# Patient Record
Sex: Male | Born: 1985 | Race: Black or African American | Hispanic: No | Marital: Single | Smoking: Current every day smoker
Health system: Southern US, Community
[De-identification: ages and names within clinical notes are randomized; demographics above are authoritative.]

---

## 2012-04-13 ENCOUNTER — Encounter (HOSPITAL_COMMUNITY): Payer: Self-pay | Admitting: Emergency Medicine

## 2012-04-13 ENCOUNTER — Emergency Department (INDEPENDENT_AMBULATORY_CARE_PROVIDER_SITE_OTHER)
Admission: EM | Admit: 2012-04-13 | Discharge: 2012-04-13 | Disposition: A | Discharge status: Home / Self Care | Payer: Self-pay | Source: Home / Self Care | Attending: Family Medicine | Admitting: Family Medicine

## 2012-04-13 DIAGNOSIS — S04039A Injury of optic tract and pathways, unspecified eye, initial encounter: Secondary | ICD-10-CM

## 2012-04-13 DIAGNOSIS — S04019A Injury of optic nerve, unspecified eye, initial encounter: Secondary | ICD-10-CM

## 2012-04-13 DIAGNOSIS — S0590XA Unspecified injury of unspecified eye and orbit, initial encounter: Secondary | ICD-10-CM

## 2012-04-13 MED ORDER — ACETAMINOPHEN-CODEINE #3 300-30 MG PO TABS: 1.0000 | ORAL_TABLET | Freq: Three times a day (TID) | ORAL | Status: AC | PRN
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
## 2012-06-01 ENCOUNTER — Encounter (INDEPENDENT_AMBULATORY_CARE_PROVIDER_SITE_OTHER): Payer: Medicaid Other | Admitting: Ophthalmology

## 2012-06-01 DIAGNOSIS — H35379 Puckering of macula, unspecified eye: Secondary | ICD-10-CM

## 2012-06-01 DIAGNOSIS — H31009 Unspecified chorioretinal scars, unspecified eye: Secondary | ICD-10-CM

## 2012-06-01 DIAGNOSIS — H35349 Macular cyst, hole, or pseudohole, unspecified eye: Secondary | ICD-10-CM

## 2012-06-01 DIAGNOSIS — S0510XA Contusion of eyeball and orbital tissues, unspecified eye, initial encounter: Secondary | ICD-10-CM

## 2012-06-01 DIAGNOSIS — H43819 Vitreous degeneration, unspecified eye: Secondary | ICD-10-CM

## 2012-06-09 ENCOUNTER — Ambulatory Visit (INDEPENDENT_AMBULATORY_CARE_PROVIDER_SITE_OTHER): Payer: Medicaid Other | Admitting: Internal Medicine

## 2012-06-09 ENCOUNTER — Encounter: Payer: Self-pay | Admitting: Internal Medicine

## 2012-06-09 VITALS — BP 109/73 | HR 61 | Temp 98.1°F

## 2012-06-09 DIAGNOSIS — G44329 Chronic post-traumatic headache, not intractable: Secondary | ICD-10-CM

## 2012-06-09 DIAGNOSIS — Z23 Encounter for immunization: Secondary | ICD-10-CM

## 2012-06-09 DIAGNOSIS — Z299 Encounter for prophylactic measures, unspecified: Secondary | ICD-10-CM

## 2012-06-09 DIAGNOSIS — F4310 Post-traumatic stress disorder, unspecified: Secondary | ICD-10-CM | POA: Insufficient documentation

## 2012-06-09 DIAGNOSIS — Z654 Victim of crime and terrorism: Secondary | ICD-10-CM | POA: Insufficient documentation

## 2012-06-09 DIAGNOSIS — G44059 Short lasting unilateral neuralgiform headache with conjunctival injection and tearing (SUNCT), not intractable: Secondary | ICD-10-CM

## 2012-06-09 DIAGNOSIS — R51 Headache: Secondary | ICD-10-CM

## 2012-06-09 MED ORDER — GABAPENTIN 100 MG PO CAPS: 100.0000 mg | ORAL_CAPSULE | Freq: Three times a day (TID) | ORAL | Status: DC
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
## 2012-06-12 NOTE — Progress Notes (Signed)
Agree with plan. Thank you

## 2012-06-16 ENCOUNTER — Telehealth: Payer: Self-pay | Admitting: *Deleted

## 2012-06-20 NOTE — Telephone Encounter (Signed)
Unable to reach someone

## 2012-06-22 ENCOUNTER — Ambulatory Visit (HOSPITAL_COMMUNITY): Payer: Medicaid Other

## 2012-06-23 ENCOUNTER — Encounter: Payer: Medicaid Other | Admitting: Internal Medicine

## 2012-06-23 ENCOUNTER — Ambulatory Visit (HOSPITAL_COMMUNITY)
Admission: RE | Admit: 2012-06-23 | Discharge: 2012-06-23 | Disposition: A | Discharge status: Home / Self Care | Payer: Medicaid Other | Source: Ambulatory Visit | Attending: Internal Medicine | Admitting: Internal Medicine

## 2012-06-23 ENCOUNTER — Ambulatory Visit (INDEPENDENT_AMBULATORY_CARE_PROVIDER_SITE_OTHER): Payer: Medicaid Other | Admitting: Internal Medicine

## 2012-06-23 VITALS — Ht 69.0 in | Wt 138.3 lb

## 2012-06-23 DIAGNOSIS — G44059 Short lasting unilateral neuralgiform headache with conjunctival injection and tearing (SUNCT), not intractable: Secondary | ICD-10-CM | POA: Insufficient documentation

## 2012-06-23 DIAGNOSIS — G44329 Chronic post-traumatic headache, not intractable: Secondary | ICD-10-CM

## 2012-06-23 DIAGNOSIS — R51 Headache: Secondary | ICD-10-CM | POA: Insufficient documentation

## 2012-06-23 MED ORDER — IOHEXOL 300 MG/ML  SOLN
80.0000 mL | Freq: Once | INTRAMUSCULAR | Status: AC | PRN
  Administered 2012-06-23: 80 mL via INTRAVENOUS

## 2012-06-24 NOTE — Progress Notes (Signed)
Patient ID: Bobby Vance, male   DOB: 1985-08-14, 26 y.o.   MRN: [id number]   Pt was sent to Radiology without being evaluated as he was to have CT head with contrast before his clinic visit.

## 2012-07-11 ENCOUNTER — Emergency Department (HOSPITAL_COMMUNITY)
Admission: EM | Admit: 2012-07-11 | Discharge: 2012-07-11 | Disposition: A | Discharge status: Home / Self Care | Payer: Medicaid Other | Attending: Emergency Medicine | Admitting: Emergency Medicine

## 2012-07-11 ENCOUNTER — Encounter (HOSPITAL_COMMUNITY): Payer: Self-pay | Admitting: Emergency Medicine

## 2012-07-11 DIAGNOSIS — G44329 Chronic post-traumatic headache, not intractable: Secondary | ICD-10-CM | POA: Insufficient documentation

## 2012-07-11 DIAGNOSIS — H539 Unspecified visual disturbance: Secondary | ICD-10-CM | POA: Insufficient documentation

## 2012-07-11 DIAGNOSIS — H35379 Puckering of macula, unspecified eye: Secondary | ICD-10-CM | POA: Insufficient documentation

## 2012-07-11 DIAGNOSIS — F17200 Nicotine dependence, unspecified, uncomplicated: Secondary | ICD-10-CM | POA: Insufficient documentation

## 2012-07-11 DIAGNOSIS — R110 Nausea: Secondary | ICD-10-CM | POA: Insufficient documentation

## 2012-07-11 DIAGNOSIS — H35349 Macular cyst, hole, or pseudohole, unspecified eye: Secondary | ICD-10-CM | POA: Insufficient documentation

## 2012-07-11 MED ORDER — KETOROLAC TROMETHAMINE 30 MG/ML IJ SOLN
30.0000 mg | Freq: Once | INTRAMUSCULAR | Status: AC
  Administered 2012-07-11: 30 mg via INTRAVENOUS
  Filled 2012-07-11: qty 1

## 2012-07-11 MED ORDER — IBUPROFEN 600 MG PO TABS: 600.0000 mg | ORAL_TABLET | Freq: Four times a day (QID) | ORAL | Status: DC | PRN

## 2012-07-11 MED ORDER — ONDANSETRON HCL 4 MG/2ML IJ SOLN
4.0000 mg | Freq: Once | INTRAMUSCULAR | Status: AC
  Administered 2012-07-11: 4 mg via INTRAVENOUS
  Filled 2012-07-11: qty 2

## 2012-07-11 NOTE — ED Notes (Addendum)
PT. REPORTS PERSISTENT HEADACHE/SLIGHT DIZZY  FOR SEVERAL DAYS WORSE THIS EVENING , DENIES INJURY , NO NAUSEA OR VOMITTING , DENIES FEVER OR CHILLS , NO BLURRED VISION , PT. SPEAKS [place] ( [place]) - USED INTERPRETER PHONE DURING ENCOUNTER AT TRIAGE .

## 2012-07-11 NOTE — ED Notes (Signed)
Pt in via EMS, per EMS- pt in c/o headache and n/v, IV started PTA.

## 2012-07-11 NOTE — ED Notes (Signed)
[employer] officer replied that a officer has been dispatched to come get pt from ED. Pt remains in room waiting for ride.

## 2012-07-11 NOTE — ED Notes (Signed)
Earley Favor remains at bedside with interpreter on phone

## 2012-07-11 NOTE — ED Notes (Signed)
Earley Favor, NP at bedside
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
## 2012-07-11 NOTE — ED Notes (Signed)
Pt states that he will need a ride home; states he is not familiar with the area.

## 2012-07-11 NOTE — ED Notes (Signed)
Used translator phone to give pt d/c instructions and prescription. Pt states understands d/c teaching and prescription and follow up information. Pt has no more questions and is waiting for ride from [facility] officer that is on the way. Pt states decrease in pain.

## 2012-07-11 NOTE — ED Notes (Signed)
Spoke with [employer] about getting pt a ride home.

## 2012-07-11 NOTE — ED Notes (Signed)
Communicating with pt via interpreter phone. Pt states room is spinning; pt states pain better since pain medication; pt states pain 5/10.

## 2012-07-11 NOTE — ED Notes (Signed)
[employer] office came to pick up pt from ED and will be taking pt home.
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
## 2012-07-12 MED ORDER — LIDOCAINE HCL (CARDIAC) 20 MG/ML IV SOLN
INTRAVENOUS | Status: AC
  Filled 2012-07-12: qty 5

## 2012-07-12 MED ORDER — ETOMIDATE 2 MG/ML IV SOLN
INTRAVENOUS | Status: AC
  Filled 2012-07-12: qty 20

## 2012-07-12 MED ORDER — SUCCINYLCHOLINE CHLORIDE 20 MG/ML IJ SOLN
INTRAMUSCULAR | Status: AC
  Filled 2012-07-12: qty 1

## 2012-07-12 MED ORDER — MIDAZOLAM HCL 2 MG/2ML IJ SOLN
INTRAMUSCULAR | Status: AC
  Filled 2012-07-12: qty 2

## 2012-07-12 MED ORDER — ROCURONIUM BROMIDE 50 MG/5ML IV SOLN
INTRAVENOUS | Status: AC
  Filled 2012-07-12: qty 2

## 2012-07-12 NOTE — ED Provider Notes (Signed)
Medical screening examination/treatment/procedure(s) were performed by non-physician practitioner and as supervising physician I was immediately available for consultation/collaboration.  Flint Melter, MD 07/12/12 [phone]

## 2012-07-18 ENCOUNTER — Emergency Department (HOSPITAL_COMMUNITY)
Admission: EM | Admit: 2012-07-18 | Discharge: 2012-07-19 | Disposition: A | Discharge status: Home / Self Care | Payer: Medicaid Other | Attending: Emergency Medicine | Admitting: Emergency Medicine

## 2012-07-18 DIAGNOSIS — F17200 Nicotine dependence, unspecified, uncomplicated: Secondary | ICD-10-CM | POA: Insufficient documentation

## 2012-07-18 DIAGNOSIS — G44329 Chronic post-traumatic headache, not intractable: Secondary | ICD-10-CM | POA: Insufficient documentation

## 2012-07-18 DIAGNOSIS — H547 Unspecified visual loss: Secondary | ICD-10-CM | POA: Insufficient documentation

## 2012-07-18 DIAGNOSIS — Z87828 Personal history of other (healed) physical injury and trauma: Secondary | ICD-10-CM | POA: Insufficient documentation

## 2012-07-18 NOTE — ED Notes (Signed)
Bed:WA14<BR> Expected date:<BR> Expected time:<BR> Means of arrival:<BR> Comments:<BR> EMS

## 2012-07-18 NOTE — ED Notes (Signed)
Headache for last 2 hours. Hx of headaches for last 2 years. Pt speaks Tigrinya. From [place]. Neuro intact. Ambulatory.

## 2012-07-19 MED ORDER — ONDANSETRON 8 MG PO TBDP
8.0000 mg | ORAL_TABLET | Freq: Once | ORAL | Status: AC
  Administered 2012-07-19: 8 mg via ORAL
  Filled 2012-07-19: qty 1

## 2012-07-19 MED ORDER — KETOROLAC TROMETHAMINE 30 MG/ML IJ SOLN
30.0000 mg | Freq: Once | INTRAMUSCULAR | Status: AC
  Administered 2012-07-19: 30 mg via INTRAMUSCULAR
  Filled 2012-07-19: qty 1

## 2012-07-19 MED ORDER — MELOXICAM 7.5 MG PO TABS: 7.5000 mg | ORAL_TABLET | Freq: Two times a day (BID) | ORAL | Status: DC
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
## 2012-07-21 ENCOUNTER — Encounter: Payer: Medicaid Other | Admitting: Internal Medicine

## 2012-07-21 ENCOUNTER — Ambulatory Visit (INDEPENDENT_AMBULATORY_CARE_PROVIDER_SITE_OTHER): Payer: Medicaid Other | Admitting: Internal Medicine

## 2012-07-21 DIAGNOSIS — G44309 Post-traumatic headache, unspecified, not intractable: Secondary | ICD-10-CM

## 2012-07-21 DIAGNOSIS — H5440 Blindness, one eye, unspecified eye: Secondary | ICD-10-CM

## 2012-07-21 MED ORDER — KETOROLAC TROMETHAMINE 30 MG/ML IM SOLN: 30.0000 mg | Freq: Once | INTRAMUSCULAR | Status: DC

## 2012-07-21 MED ORDER — KETOROLAC TROMETHAMINE 30 MG/ML IJ SOLN
30.0000 mg | Freq: Once | INTRAMUSCULAR | Status: AC
  Administered 2012-07-21: 30 mg via INTRAMUSCULAR

## 2012-07-21 MED ORDER — OXYCODONE-ACETAMINOPHEN 10-325 MG PO TABS: 1.0000 | ORAL_TABLET | Freq: Four times a day (QID) | ORAL | Status: DC | PRN
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
## 2012-07-21 NOTE — Addendum Note (Signed)
Addended by: Carolan Clines on: 07/21/2012 04:58 PM   Modules accepted: Orders

## 2012-07-22 NOTE — ED Provider Notes (Signed)
Medical screening examination/treatment/procedure(s) were performed by non-physician practitioner and as supervising physician I was immediately available for consultation/collaboration.  Yarelly Kuba, MD 07/22/12 0704 

## 2012-08-28 NOTE — Addendum Note (Signed)
Addended by: Neomia Dear on: 08/28/2012 07:11 PM   Modules accepted: Orders

## 2012-09-01 ENCOUNTER — Encounter: Payer: Medicaid Other | Admitting: Internal Medicine

## 2012-09-05 ENCOUNTER — Telehealth: Payer: Self-pay | Admitting: *Deleted

## 2012-09-05 NOTE — Telephone Encounter (Signed)
Call to Dr. Janetta Hora office to check on message sent back about referral.  Spoke to office person there who schedules the appointments.  Dr. Velora Heckler has declined to see the pt.-pt will need to be referred by the Opthalmology physician who saw him at [facility]-Dr. Luciano Cutter.  Call to Dr. Allie Dimmer office at [phone] spoke with Dewaine Oats who will get a message to Dr.  Luciano Cutter to see if a referral can be done.  Angelina Ok, RN 09/05/2012 11:10 AM.

## 2012-09-13 ENCOUNTER — Ambulatory Visit: Payer: Medicaid Other | Admitting: Internal Medicine

## 2012-09-14 ENCOUNTER — Emergency Department (HOSPITAL_COMMUNITY)
Admission: EM | Admit: 2012-09-14 | Discharge: 2012-09-14 | Disposition: A | Discharge status: Home / Self Care | Payer: Medicaid Other | Attending: Emergency Medicine | Admitting: Emergency Medicine

## 2012-09-14 DIAGNOSIS — R51 Headache: Secondary | ICD-10-CM | POA: Insufficient documentation

## 2012-09-14 DIAGNOSIS — H538 Other visual disturbances: Secondary | ICD-10-CM | POA: Insufficient documentation

## 2012-09-14 DIAGNOSIS — Z79899 Other long term (current) drug therapy: Secondary | ICD-10-CM | POA: Insufficient documentation

## 2012-09-14 DIAGNOSIS — F4310 Post-traumatic stress disorder, unspecified: Secondary | ICD-10-CM | POA: Insufficient documentation

## 2012-09-14 DIAGNOSIS — R209 Unspecified disturbances of skin sensation: Secondary | ICD-10-CM | POA: Insufficient documentation

## 2012-09-14 DIAGNOSIS — F17200 Nicotine dependence, unspecified, uncomplicated: Secondary | ICD-10-CM | POA: Insufficient documentation

## 2012-09-14 DIAGNOSIS — G44329 Chronic post-traumatic headache, not intractable: Secondary | ICD-10-CM

## 2012-09-14 MED ORDER — DIPHENHYDRAMINE HCL 50 MG/ML IJ SOLN
25.0000 mg | Freq: Once | INTRAMUSCULAR | Status: AC
  Administered 2012-09-14: 25 mg via INTRAMUSCULAR
  Filled 2012-09-14: qty 1

## 2012-09-14 MED ORDER — HYDROCODONE-ACETAMINOPHEN 5-325 MG PO TABS
2.0000 | ORAL_TABLET | Freq: Once | ORAL | Status: AC
  Administered 2012-09-14: 2 via ORAL
  Filled 2012-09-14: qty 2

## 2012-09-14 MED ORDER — METOCLOPRAMIDE HCL 5 MG/ML IJ SOLN
10.0000 mg | Freq: Once | INTRAMUSCULAR | Status: AC
  Administered 2012-09-14: 10 mg via INTRAMUSCULAR
  Filled 2012-09-14: qty 2

## 2012-09-14 MED ORDER — HYDROCODONE-ACETAMINOPHEN 5-325 MG PO TABS: 2.0000 | ORAL_TABLET | ORAL | Status: DC | PRN

## 2012-09-14 MED ORDER — KETOROLAC TROMETHAMINE 60 MG/2ML IM SOLN
60.0000 mg | Freq: Once | INTRAMUSCULAR | Status: AC
  Administered 2012-09-14: 60 mg via INTRAMUSCULAR
  Filled 2012-09-14: qty 2

## 2012-09-14 NOTE — ED Notes (Signed)
Pt sleeping in bed

## 2012-09-14 NOTE — ED Notes (Signed)
Pt arrived by [facility] with c/o headache. EMS stated that pt was seen here or at [facility] recently for same c/o and was given a shot that helped the headache but the headache returned. Pt does not speak english and speaks tigrignia.

## 2012-09-14 NOTE — ED Notes (Signed)
Interpreter phones were used to communicate assessment and plan of care with pt. Orders to follow.
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
## 2012-09-15 NOTE — ED Provider Notes (Signed)
  Medical screening examination/treatment/procedure(s) were performed by non-physician practitioner and as supervising physician I was immediately available for consultation/collaboration.    Juanelle Trueheart, MD 09/15/12 0008 

## 2012-09-19 ENCOUNTER — Inpatient Hospital Stay: Admission: RE | Admit: 2012-09-19 | Payer: Medicaid Other | Source: Ambulatory Visit

## 2012-09-19 ENCOUNTER — Other Ambulatory Visit: Payer: Self-pay | Admitting: Infectious Diseases

## 2012-09-19 DIAGNOSIS — R7611 Nonspecific reaction to tuberculin skin test without active tuberculosis: Secondary | ICD-10-CM

## 2012-09-21 ENCOUNTER — Ambulatory Visit
Admission: RE | Admit: 2012-09-21 | Discharge: 2012-09-21 | Disposition: A | Discharge status: Home / Self Care | Payer: No Typology Code available for payment source | Source: Ambulatory Visit | Attending: Infectious Diseases | Admitting: Infectious Diseases

## 2012-09-21 DIAGNOSIS — R7611 Nonspecific reaction to tuberculin skin test without active tuberculosis: Secondary | ICD-10-CM

## 2012-09-22 ENCOUNTER — Ambulatory Visit (INDEPENDENT_AMBULATORY_CARE_PROVIDER_SITE_OTHER): Payer: Medicaid Other | Admitting: Internal Medicine

## 2012-09-22 ENCOUNTER — Encounter: Payer: Self-pay | Admitting: Internal Medicine

## 2012-09-22 VITALS — BP 104/64 | HR 76 | Temp 98.5°F | Ht 67.0 in | Wt 135.8 lb

## 2012-09-22 DIAGNOSIS — F4310 Post-traumatic stress disorder, unspecified: Secondary | ICD-10-CM

## 2012-09-22 DIAGNOSIS — G44329 Chronic post-traumatic headache, not intractable: Secondary | ICD-10-CM

## 2012-09-22 NOTE — Patient Instructions (Addendum)
General Instructions: Please schedule a follow up appointment in 2 months . Please bring your medication bottles with your next appointment. Please take your medicines as prescribed. Please keep up with his neurosurgery appointment. ( 10/06/12)     Treatment Goals:  Goals (1 Years of Data) as of 09/22/12   None      Progress Toward Treatment Goals:    Self Care Goals & Plans:       Care Management & Community Referrals:
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
## 2012-09-22 NOTE — Assessment & Plan Note (Signed)
He continues to complain of chronic posttraumatic headaches. He has tried Neurontin, Percocet and Vicodin as which hasn't helped him at all. He is not interested in taking any other new medications as well. He has an appointment up coming with neurosurgery at [facility] on 10/06/2012. His case manager states that she would definitely take him to his appointment. Continue to monitor his headaches are now.
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
## 2012-10-09 ENCOUNTER — Telehealth: Payer: Self-pay | Admitting: *Deleted
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
## 2012-10-11 ENCOUNTER — Emergency Department (HOSPITAL_COMMUNITY)
Admission: EM | Admit: 2012-10-11 | Discharge: 2012-10-11 | Disposition: A | Discharge status: Home / Self Care | Payer: Medicaid Other | Source: Home / Self Care | Attending: Emergency Medicine | Admitting: Emergency Medicine

## 2012-10-11 ENCOUNTER — Encounter (HOSPITAL_COMMUNITY): Payer: Self-pay | Admitting: Emergency Medicine

## 2012-10-11 DIAGNOSIS — G44329 Chronic post-traumatic headache, not intractable: Secondary | ICD-10-CM

## 2012-10-11 MED ORDER — AMITRIPTYLINE HCL 25 MG PO TABS: 25.0000 mg | ORAL_TABLET | Freq: Every day | ORAL | Status: DC

## 2012-10-11 MED ORDER — ACETAMINOPHEN-CODEINE #3 300-30 MG PO TABS: 1.0000 | ORAL_TABLET | ORAL | Status: DC | PRN

## 2012-10-11 MED ORDER — NAPROXEN 500 MG PO TABS: 500.0000 mg | ORAL_TABLET | Freq: Two times a day (BID) | ORAL | Status: DC
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
## 2012-10-27 ENCOUNTER — Encounter: Payer: Self-pay | Admitting: Internal Medicine

## 2012-10-27 ENCOUNTER — Ambulatory Visit (INDEPENDENT_AMBULATORY_CARE_PROVIDER_SITE_OTHER): Payer: Medicaid Other | Admitting: Internal Medicine

## 2012-10-27 VITALS — BP 102/64 | HR 74 | Temp 97.9°F | Ht 67.0 in | Wt 135.1 lb

## 2012-10-27 DIAGNOSIS — G44329 Chronic post-traumatic headache, not intractable: Secondary | ICD-10-CM
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
## 2012-11-13 ENCOUNTER — Telehealth: Payer: Self-pay | Admitting: *Deleted

## 2012-11-13 NOTE — Telephone Encounter (Signed)
Call to [facility] Neurosurgery Department.  New patients are not being taken until June will need to call back in June to refer patient to the Neurosurgery Department.  Dr. Zola Button was informed of.  Message left with Congregational Nurse Marisue Humble that [facility] had been called and that I would need to call back in June. Angelina Ok, RN 11/13/2012 11:37 AM

## 2012-11-21 ENCOUNTER — Encounter: Payer: Self-pay | Admitting: Neurology

## 2012-11-21 ENCOUNTER — Ambulatory Visit (INDEPENDENT_AMBULATORY_CARE_PROVIDER_SITE_OTHER): Payer: Medicaid Other | Admitting: Neurology

## 2012-11-21 VITALS — BP 107/65 | HR 74 | Temp 98.8°F | Ht 69.5 in | Wt 136.0 lb

## 2012-11-21 DIAGNOSIS — G44309 Post-traumatic headache, unspecified, not intractable: Secondary | ICD-10-CM

## 2012-11-21 MED ORDER — AMITRIPTYLINE HCL 25 MG PO TABS: ORAL_TABLET | ORAL | Status: DC
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
## 2012-11-21 NOTE — Patient Instructions (Addendum)
I think overall you are doing fairly well but I do want to suggest a few things today:  Remember to drink plenty of fluid, eat healthy meals and do not skip any meals. Try to eat protein with a every meal and eat a healthy snack such as fruit or nuts in between meals. Try to keep a regular sleep-wake schedule and try to exercise daily, particularly in the form of walking, 20-30 minutes a day, if you can.   Please remember, common headache triggers are: sleep deprivation, dehydration, overheating, stress, hypoglycemia or skipping meals, excessive pain medications or excessive alcohol use. Some people have food triggers such as aged cheese, orange juice or chocolate, especially dark chocolate. Try to avoid these headache triggers as much possible. It may be helpful to keep a headache diary to figure out what makes your headaches worse or brings them on. Some people report headache onset after exercise but studies have shown that regular exercise may actually prevent headaches from coming on. If you have exercise-induced headaches, please make sure that you drink plenty of fluid before and after exercising and that you don't over do it.  As far as your medications are concerned, I would like to suggest a trial of amitriptyline and slowly titrating it up. We talked about potential side effects and the need to take the medication daily.   I would like to see you back in 3 months, sooner if we need to. Please call us with any interim questions, concerns, problems, updates or refill requests.  Please also call us for any test results so we can go over those with you on the phone. Brett Canales is my clinical assistant and will answer any of your questions and relay your messages to me and also relay most of my messages to you.  Our phone number is [phone]. We also have an after hours call service for urgent matters and there is a physician on-call for urgent questions. For any emergencies you know to call 911 or  go to the nearest emergency room.

## 2013-01-22 ENCOUNTER — Other Ambulatory Visit: Payer: Self-pay | Admitting: Internal Medicine

## 2013-01-22 ENCOUNTER — Encounter: Payer: Self-pay | Admitting: Internal Medicine

## 2013-02-13 ENCOUNTER — Telehealth: Payer: Self-pay | Admitting: *Deleted

## 2013-02-13 NOTE — Telephone Encounter (Signed)
Call from [facility]- patient will be unable to be seen there.  Office to shred referral sent to their office.  Angelina Ok, RN 02/13/2013.

## 2013-02-20 ENCOUNTER — Ambulatory Visit: Payer: No Typology Code available for payment source | Attending: Family Medicine | Admitting: Internal Medicine

## 2013-02-20 ENCOUNTER — Encounter: Payer: Self-pay | Admitting: Family Medicine

## 2013-02-20 ENCOUNTER — Ambulatory Visit: Payer: Medicaid Other | Admitting: Neurology

## 2013-02-20 VITALS — BP 99/62 | HR 72 | Temp 98.4°F | Resp 14 | Ht 66.0 in | Wt 137.0 lb

## 2013-02-20 DIAGNOSIS — IMO0001 Reserved for inherently not codable concepts without codable children: Secondary | ICD-10-CM | POA: Insufficient documentation

## 2013-02-20 DIAGNOSIS — X58XXXS Exposure to other specified factors, sequela: Secondary | ICD-10-CM | POA: Insufficient documentation

## 2013-02-20 DIAGNOSIS — G44329 Chronic post-traumatic headache, not intractable: Secondary | ICD-10-CM | POA: Insufficient documentation

## 2013-02-20 MED ORDER — TRAMADOL HCL 50 MG PO TABS: 50.0000 mg | ORAL_TABLET | Freq: Three times a day (TID) | ORAL | Status: DC | PRN

## 2013-02-20 NOTE — Patient Instructions (Addendum)
Migraine Headache A migraine headache is an intense, throbbing pain on one or both sides of your head. A migraine can last for 30 minutes to several hours. CAUSES  The exact cause of a migraine headache is not always known. However, a migraine may be caused when nerves in the brain become irritated and release chemicals that cause inflammation. This causes pain. SYMPTOMS  Pain on one or both sides of your head.  Pulsating or throbbing pain.  Severe pain that prevents daily activities.  Pain that is aggravated by any physical activity.  Nausea, vomiting, or both.  Dizziness.  Pain with exposure to bright lights, loud noises, or activity.  General sensitivity to bright lights, loud noises, or smells. Before you get a migraine, you may get warning signs that a migraine is coming (aura). An aura may include:  Seeing flashing lights.  Seeing bright spots, halos, or zig-zag lines.  Having tunnel vision or blurred vision.  Having feelings of numbness or tingling.  Having trouble talking.  Having muscle weakness. MIGRAINE TRIGGERS  Alcohol.  Smoking.  Stress.  Menstruation.  Aged cheeses.  Foods or drinks that contain nitrates, glutamate, aspartame, or tyramine.  Lack of sleep.  Chocolate.  Caffeine.  Hunger.  Physical exertion.  Fatigue.  Medicines used to treat chest pain (nitroglycerine), birth control pills, estrogen, and some blood pressure medicines. DIAGNOSIS  A migraine headache is often diagnosed based on:  Symptoms.  Physical examination.  A CT scan or MRI of your head. TREATMENT Medicines may be given for pain and nausea. Medicines can also be given to help prevent recurrent migraines.  HOME CARE INSTRUCTIONS  Only take over-the-counter or prescription medicines for pain or discomfort as directed by your caregiver. The use of long-term narcotics is not recommended.  Lie down in a dark, quiet room when you have a migraine.  Keep a journal  to find out what may trigger your migraine headaches. For example, write down:  What you eat and drink.  How much sleep you get.  Any change to your diet or medicines.  Limit alcohol consumption.  Quit smoking if you smoke.  Get 7 to 9 hours of sleep, or as recommended by your caregiver.  Limit stress.  Keep lights dim if bright lights bother you and make your migraines worse. SEEK IMMEDIATE MEDICAL CARE IF:   Your migraine becomes severe.  You have a fever.  You have a stiff neck.  You have vision loss.  You have muscular weakness or loss of muscle control.  You start losing your balance or have trouble walking.  You feel faint or pass out.  You have severe symptoms that are different from your first symptoms. MAKE SURE YOU:   Understand these instructions.  Will watch your condition.  Will get help right away if you are not doing well or get worse. Document Released: 07/19/2005 Document Revised: 10/11/2011 Document Reviewed: 07/09/2011 Exit[employer] Patient Information 2014 [employer].  

## 2013-02-20 NOTE — Progress Notes (Signed)
Patient ID: Bobby Vance, male   DOB: 21-Jan-1986, 27 y.o.   MRN: [id number]  CC: New patient  HPI: 27 year old male with past medical history of posttraumatic stress disorder and chronic headaches who presented to our clinic to establish care. Patient reports chronic headaches and does not really take any medications for it. No blurry vision or lightheadedness or dizziness. No chest pain or palpitations. No abdominal pain, nausea or vomiting. No blood in the stool or urine. No fever or chills. No neck stiffness or neck pain.  No Known Allergies History reviewed. No pertinent past medical history. Current Outpatient Prescriptions on File Prior to Visit  Medication Sig Dispense Refill  . oxyCODONE-acetaminophen (PERCOCET) 10-325 MG per tablet 1 tablet. Take 1 tablet by mouth every 6 (six) hours as needed for Pain.      Bertram Gala Glycol-Propyl Glycol (SYSTANE OP) Place 1 drop into both eyes daily as needed. For dry/irritated eyes       No current facility-administered medications on file prior to visit.   Family History  Problem Relation Age of Onset  . Hypertension Mother   . Hypertension Father    History   Social History  . Marital Status: Single    Spouse Name: N/A    Number of Children: N/A  . Years of Education: N/A   Occupational History  . Not on file.   Social History Main Topics  . Smoking status: Former Smoker    Types: Cigarettes    Quit date: 02/17/2013  . Smokeless tobacco: Not on file  . Alcohol Use: No  . Drug Use: No  . Sexually Active: Not on file   Other Topics Concern  . Not on file   Social History Narrative  . No narrative on file    Review of Systems  Constitutional: Negative for fever, chills, diaphoresis, activity change, appetite change and fatigue.  HENT: Negative for ear pain, nosebleeds, congestion, facial swelling, rhinorrhea, neck pain, neck stiffness and ear discharge.   Eyes: Negative for pain, discharge, redness, itching and visual  disturbance.  Respiratory: Negative for cough, choking, chest tightness, shortness of breath, wheezing and stridor.   Cardiovascular: Negative for chest pain, palpitations and leg swelling.  Gastrointestinal: Negative for abdominal distention.  Genitourinary: Negative for dysuria, urgency, frequency, hematuria, flank pain, decreased urine volume, difficulty urinating and dyspareunia.  Musculoskeletal: Negative for back pain, joint swelling, arthralgias and gait problem.  Neurological: Negative for dizziness, tremors, seizures, syncope, facial asymmetry, speech difficulty, weakness, light-headedness, numbness and positive for chronic headaches.  Hematological: Negative for adenopathy. Does not bruise/bleed easily.  Psychiatric/Behavioral: Negative for hallucinations, behavioral problems, confusion, dysphoric mood, decreased concentration and agitation.    Objective:   Filed Vitals:   02/20/13 1227  BP: 99/62  Pulse: 72  Temp:   Resp:     Physical Exam  Constitutional: Appears well-developed and well-nourished. No distress.  HENT: Normocephalic. External right and left ear normal. Oropharynx is clear and moist.  Eyes: Conjunctivae and EOM are normal. PERRLA, no scleral icterus.  Neck: Normal ROM. Neck supple. No JVD. No tracheal deviation. No thyromegaly.  CVS: RRR, S1/S2 +, no murmurs, no gallops, no carotid bruit.  Pulmonary: Effort and breath sounds normal, no stridor, rhonchi, wheezes, rales.  Abdominal: Soft. BS +,  no distension, tenderness, rebound or guarding.  Musculoskeletal: Normal range of motion. No edema and no tenderness.  Lymphadenopathy: No lymphadenopathy noted, cervical, inguinal. Neuro: Alert. Normal reflexes, muscle tone coordination. No cranial nerve deficit. Skin: Skin is warm  and dry. No rash noted. Not diaphoretic. No erythema. No pallor.  Psychiatric: Normal mood and affect. Behavior, judgment, thought content normal.   No results found for this basename:  WBC, HGB, HCT, MCV, PLT   No results found for this basename: CREATININE, BUN, NA, K, CL, CO2    No results found for this basename: HGBA1C   Lipid Panel  No results found for this basename: chol, trig, hdl, cholhdl, vldl, ldlcalc       Assessment and plan:   Patient Active Problem List   Diagnosis Date Noted  . Headache, post-traumatic, chronic - Prescription provided for Ultram  06/09/2012  . Health maintenance  - 6 CBC, BMP, lipid panel and TSH  06/09/2012

## 2013-02-20 NOTE — Progress Notes (Signed)
Pt here to establish care. Tigrinian language only Translator here Hx. 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 vss Denies medical hx.

## 2013-02-21 LAB — LIPID PANEL
Cholesterol: 95 mg/dL (ref 0–200)
HDL: 28 mg/dL — ABNORMAL LOW (ref >39)
Total CHOL/HDL Ratio: 3.4 Ratio
Triglycerides: 124 mg/dL (ref <150)

## 2013-02-21 LAB — CBC WITH DIFFERENTIAL/PLATELET
Basophils Absolute: 0 10*3/uL (ref 0.0–0.1)
Basophils Relative: 0 % (ref 0–1)
Eosinophils Absolute: 0.3 10*3/uL (ref 0.0–0.7)
Eosinophils Relative: 10 % — ABNORMAL HIGH (ref 0–5)
Lymphs Abs: 1 10*3/uL (ref 0.7–4.0)
MCH: 29.1 pg (ref 26.0–34.0)
MCV: 83.1 fL (ref 78.0–100.0)
Neutrophils Relative %: 52 % (ref 43–77)
Platelets: 166 10*3/uL (ref 150–400)
RBC: 5.32 MIL/uL (ref 4.22–5.81)
RDW: 13.9 % (ref 11.5–15.5)

## 2013-02-21 LAB — TSH: TSH: <0.008 u[IU]/mL — ABNORMAL LOW (ref 0.350–4.500)

## 2013-02-21 LAB — BASIC METABOLIC PANEL
CO2: 29 mEq/L (ref 19–32)
Calcium: 10 mg/dL (ref 8.4–10.5)
Creat: 0.48 mg/dL — ABNORMAL LOW (ref 0.50–1.35)
Sodium: 142 mEq/L (ref 135–145)

## 2013-03-02 ENCOUNTER — Ambulatory Visit: Payer: No Typology Code available for payment source | Attending: Family Medicine | Admitting: Family Medicine

## 2013-03-02 VITALS — BP 107/69 | HR 74 | Temp 98.9°F | Resp 16 | Ht 69.0 in | Wt 138.0 lb

## 2013-03-02 DIAGNOSIS — G44329 Chronic post-traumatic headache, not intractable: Secondary | ICD-10-CM

## 2013-03-02 DIAGNOSIS — E0590 Thyrotoxicosis, unspecified without thyrotoxic crisis or storm: Secondary | ICD-10-CM

## 2013-03-02 DIAGNOSIS — F4310 Post-traumatic stress disorder, unspecified: Secondary | ICD-10-CM

## 2013-03-02 MED ORDER — METHIMAZOLE 5 MG PO TABS: 5.0000 mg | ORAL_TABLET | Freq: Three times a day (TID) | ORAL | Status: DC

## 2013-03-02 NOTE — Patient Instructions (Addendum)
Hyperthyroidism The thyroid is a large gland located in the lower front part of your neck. The thyroid helps control metabolism. Metabolism is how your body uses food. It controls metabolism with the hormone thyroxine. When the thyroid is overactive, it produces too much hormone. When this happens, these following problems may occur:   Nervousness  Heat intolerance  Weight loss (in spite of increase food intake)  Diarrhea  Change in hair or skin texture  Palpitations (heart skipping or having extra beats)  Tachycardia (rapid heart rate)  Loss of menstruation (amenorrhea)  Shaking of the hands CAUSES  Grave's Disease (the immune system attacks the thyroid gland). This is the most common cause.  Inflammation of the thyroid gland.  Tumor (usually benign) in the thyroid gland or elsewhere.  Excessive use of thyroid medications (both prescription and 'natural').  Excessive ingestion of Iodine. DIAGNOSIS  To prove hyperthyroidism, your caregiver may do blood tests and ultrasound tests. Sometimes the signs are hidden. It may be necessary for your caregiver to watch this illness with blood tests, either before or after diagnosis and treatment. TREATMENT Short-term treatment There are several treatments to control symptoms. Drugs called beta blockers may give some relief. Drugs that decrease hormone production will provide temporary relief in many people. These measures will usually not give permanent relief. Definitive therapy There are treatments available which can be discussed between you and your caregiver which will treat the problem. These treatments range from surgery (removal of the thyroid), to the use of radioactive iodine (destroys the thyroid by radiation), to the use of antithyroid drugs (interfere with hormonpermanently e synthesis). The first two treatments are permanent and usually successful. They most often require hormone replacement therapy for life. This is because  it is impossible to remove or destroy the exact amount of thyroid required to make a person euthyroid (normal). HOME CARE INSTRUCTIONS  See your caregiver if the problems you are being treated for get worse. Examples of this would be the problems listed above. SEEK MEDICAL CARE IF: Your general condition worsens. MAKE SURE YOU:   Understand these instructions.  Will watch your condition.  Will get help right away if you are not doing well or get worse. Document Released: 07/19/2005 Document Revised: 10/11/2011 Document Reviewed: 11/30/2006 [facility] Patient Information 2014 [place], [place].

## 2013-03-02 NOTE — Progress Notes (Signed)
Patient presents for follow up to review Thyroid levels.
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
## 2013-03-03 LAB — T3 UPTAKE: T3 Uptake: 42.6 % — ABNORMAL HIGH (ref 22.5–37.0)

## 2013-03-04 NOTE — Progress Notes (Signed)
Quick Note:  Interpreter will be needed: Please inform patient that thyroid hormone levels came back very high. He has hyperthyroidism. Tell he can go ahead and get prescription filled for the methimazole and start taking it. (the prescription was given to him at the office visit). He has been given a referral to an endocrinologist for further evaluation and treatment. Have patient to please call us back if he has not heard anything about his referral in the next week.   Rodney Langton, MD, CDE, FAAFP [facility] [place], [place]   ______

## 2013-03-05 ENCOUNTER — Telehealth: Payer: Self-pay | Admitting: *Deleted

## 2013-03-05 NOTE — Telephone Encounter (Signed)
03/05/13 Spoke  With Marisue Humble Flak (Congregational Nurse) regarding patient labs was informed that thyroid hormone level came back Very high. He has hyperthyroidism informed to go ahead and bet prescription filled for methimazole and start taking it. Also he has A referral to an endocrinologist for further evaluation and treatment.Have patient call back if he has not heard from his referral in the  Next week. P.Misheel Gowans,RN BSN [facility]

## 2013-03-05 NOTE — Telephone Encounter (Signed)
Talked with Casimiro Needle, patient's contact that speaks english, and informed him of Bobby Vance's [place] appointment this Wednesday at 7:45 with [facility].

## 2013-03-05 NOTE — Telephone Encounter (Signed)
03/05/13 Attempt to reach patient no availability.  P.Kathaleya Mcduffee,RN BSN [facility]

## 2013-03-07 ENCOUNTER — Ambulatory Visit (HOSPITAL_COMMUNITY)
Admission: RE | Admit: 2013-03-07 | Discharge: 2013-03-07 | Disposition: A | Discharge status: Home / Self Care | Payer: No Typology Code available for payment source | Source: Ambulatory Visit | Attending: Family Medicine | Admitting: Family Medicine

## 2013-03-07 ENCOUNTER — Telehealth: Payer: Self-pay

## 2013-03-07 DIAGNOSIS — E0590 Thyrotoxicosis, unspecified without thyrotoxic crisis or storm: Secondary | ICD-10-CM | POA: Insufficient documentation

## 2013-03-07 DIAGNOSIS — R9389 Abnormal findings on diagnostic imaging of other specified body structures: Secondary | ICD-10-CM | POA: Insufficient documentation

## 2013-03-07 NOTE — Progress Notes (Signed)
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 Please be sure to follow up with the endocrinologist and take medications as prescribed.   Rodney Langton, MD, CDE, FAAFP [facility] [place], [place]   ______

## 2013-03-07 NOTE — Telephone Encounter (Signed)
Left message with person who answered to have him please return our call

## 2013-03-07 NOTE — Telephone Encounter (Signed)
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  Please be sure to follow up with the endocrinologist and take medications as prescribed.              Rodney Langton, MD, CDE, FAAFP      [facility]      [facility]      [place], [place]        ------

## 2013-03-23 ENCOUNTER — Encounter: Payer: Self-pay | Admitting: Internal Medicine

## 2013-03-23 ENCOUNTER — Ambulatory Visit: Payer: No Typology Code available for payment source | Attending: Family Medicine | Admitting: Internal Medicine

## 2013-03-23 ENCOUNTER — Ambulatory Visit: Payer: No Typology Code available for payment source

## 2013-03-23 VITALS — BP 102/65 | HR 72 | Temp 98.4°F | Resp 16 | Wt 135.0 lb

## 2013-03-23 DIAGNOSIS — E0500 Thyrotoxicosis with diffuse goiter without thyrotoxic crisis or storm: Secondary | ICD-10-CM | POA: Insufficient documentation

## 2013-03-23 DIAGNOSIS — E0590 Thyrotoxicosis, unspecified without thyrotoxic crisis or storm: Secondary | ICD-10-CM

## 2013-03-23 MED ORDER — METHIMAZOLE 5 MG PO TABS: 5.0000 mg | ORAL_TABLET | Freq: Three times a day (TID) | ORAL | Status: DC

## 2013-03-23 NOTE — Progress Notes (Signed)
PT HERE WITH C/O CHRONIC H/A UNRELIEVED BY TRAMADOL CHRONIC EYE PROBLEMS UNRELIEVED BY EYE DROPS OTC PACIFIC TRANSLATOR USED FOR Bobby Vance

## 2013-03-23 NOTE — Patient Instructions (Signed)
Hyperthyroidism  The thyroid is a large gland located in the lower front part of your neck. The thyroid helps control metabolism. Metabolism is how your body uses food. It controls metabolism with the hormone thyroxine. When the thyroid is overactive, it produces too much hormone. When this happens, these following problems may occur:   · Nervousness  · Heat intolerance  · Weight loss (in spite of increase food intake)  · Diarrhea  · Change in hair or skin texture  · Palpitations (heart skipping or having extra beats)  · Tachycardia (rapid heart rate)  · Loss of menstruation (amenorrhea)  · Shaking of the hands  CAUSES  · Grave's Disease (the immune system attacks the thyroid gland). This is the most common cause.  · Inflammation of the thyroid gland.  · Tumor (usually benign) in the thyroid gland or elsewhere.  · Excessive use of thyroid medications (both prescription and 'natural').  · Excessive ingestion of Iodine.  DIAGNOSIS   To prove hyperthyroidism, your caregiver may do blood tests and ultrasound tests. Sometimes the signs are hidden. It may be necessary for your caregiver to watch this illness with blood tests, either before or after diagnosis and treatment.  TREATMENT  Short-term treatment  There are several treatments to control symptoms. Drugs called beta blockers may give some relief. Drugs that decrease hormone production will provide temporary relief in many people. These measures will usually not give permanent relief.  Definitive therapy  There are treatments available which can be discussed between you and your caregiver which will permanently treat the problem. These treatments range from surgery (removal of the thyroid), to the use of radioactive iodine (destroys the thyroid by radiation), to the use of antithyroid drugs (interfere with hormone synthesis). The first two treatments are permanent and usually successful. They most often require hormone replacement therapy for life. This is because  it is impossible to remove or destroy the exact amount of thyroid required to make a person euthyroid (normal).  HOME CARE INSTRUCTIONS   See your caregiver if the problems you are being treated for get worse. Examples of this would be the problems listed above.  SEEK MEDICAL CARE IF:  Your general condition worsens.  MAKE SURE YOU:   · Understand these instructions.  · Will watch your condition.  · Will get help right away if you are not doing well or get worse.  Document Released: 07/19/2005 Document Revised: 10/11/2011 Document Reviewed: 11/30/2006  [employer]® Patient Information ©2014 [employer].
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
## 2013-05-24 ENCOUNTER — Encounter: Payer: Self-pay | Admitting: Internal Medicine

## 2013-05-24 ENCOUNTER — Ambulatory Visit: Payer: No Typology Code available for payment source | Attending: Internal Medicine | Admitting: Internal Medicine

## 2013-05-24 VITALS — BP 102/64 | HR 98 | Temp 97.9°F | Resp 16 | Ht 69.0 in | Wt 139.0 lb

## 2013-05-24 DIAGNOSIS — E0500 Thyrotoxicosis with diffuse goiter without thyrotoxic crisis or storm: Secondary | ICD-10-CM

## 2013-05-24 LAB — TSH: TSH: 0.023 u[IU]/mL — ABNORMAL LOW (ref 0.350–4.500)

## 2013-05-24 MED ORDER — TRAMADOL HCL 50 MG PO TABS: 50.0000 mg | ORAL_TABLET | Freq: Three times a day (TID) | ORAL | Status: DC | PRN

## 2013-05-24 MED ORDER — METHIMAZOLE 5 MG PO TABS: 5.0000 mg | ORAL_TABLET | Freq: Three times a day (TID) | ORAL | Status: AC

## 2013-05-24 NOTE — Progress Notes (Signed)
Pt is here for a f/u visit. We used the interpretor line today Pt reports having a head ache and itchy eyes.

## 2013-05-24 NOTE — Patient Instructions (Signed)
Hyperthyroidism  The thyroid is a large gland located in the lower front part of your neck. The thyroid helps control metabolism. Metabolism is how your body uses food. It controls metabolism with the hormone thyroxine. When the thyroid is overactive, it produces too much hormone. When this happens, these following problems may occur:   · Nervousness  · Heat intolerance  · Weight loss (in spite of increase food intake)  · Diarrhea  · Change in hair or skin texture  · Palpitations (heart skipping or having extra beats)  · Tachycardia (rapid heart rate)  · Loss of menstruation (amenorrhea)  · Shaking of the hands  CAUSES  · Grave's Disease (the immune system attacks the thyroid gland). This is the most common cause.  · Inflammation of the thyroid gland.  · Tumor (usually benign) in the thyroid gland or elsewhere.  · Excessive use of thyroid medications (both prescription and 'natural').  · Excessive ingestion of Iodine.  DIAGNOSIS   To prove hyperthyroidism, your caregiver may do blood tests and ultrasound tests. Sometimes the signs are hidden. It may be necessary for your caregiver to watch this illness with blood tests, either before or after diagnosis and treatment.  TREATMENT  Short-term treatment  There are several treatments to control symptoms. Drugs called beta blockers may give some relief. Drugs that decrease hormone production will provide temporary relief in many people. These measures will usually not give permanent relief.  Definitive therapy  There are treatments available which can be discussed between you and your caregiver which will permanently treat the problem. These treatments range from surgery (removal of the thyroid), to the use of radioactive iodine (destroys the thyroid by radiation), to the use of antithyroid drugs (interfere with hormone synthesis). The first two treatments are permanent and usually successful. They most often require hormone replacement therapy for life. This is because  it is impossible to remove or destroy the exact amount of thyroid required to make a person euthyroid (normal).  HOME CARE INSTRUCTIONS   See your caregiver if the problems you are being treated for get worse. Examples of this would be the problems listed above.  SEEK MEDICAL CARE IF:  Your general condition worsens.  MAKE SURE YOU:   · Understand these instructions.  · Will watch your condition.  · Will get help right away if you are not doing well or get worse.  Document Released: 07/19/2005 Document Revised: 10/11/2011 Document Reviewed: 11/30/2006  [employer]® Patient Information ©2014 [employer].

## 2013-05-24 NOTE — Progress Notes (Signed)
Patient ID: Bobby Vance, male   DOB: 1986/02/01, 27 y.o.   MRN: [id number]  CC: follow up  HPI: 27 year old male with past medical history of Graves disease, not very compliant with medications who presented to clinic for follow up and having headaches. He said methimazole did not make him feel any better so he did not take it. He reports no cold/heat intolerance, no blurry visions. No chest pain, no palpitations, no weight loss.  No Known Allergies History reviewed. No pertinent past medical history. Current Outpatient Prescriptions on File Prior to Visit  Medication Sig Dispense Refill  . Polyethyl Glycol-Propyl Glycol (SYSTANE OP) Place 1 drop into both eyes daily as needed. For dry/irritated eyes       No current facility-administered medications on file prior to visit.   Family History  Problem Relation Age of Onset  . Hypertension Mother   . Hypertension Father    History   Social History  . Marital Status: Single    Spouse Name: N/A    Number of Children: N/A  . Years of Education: N/A   Occupational History  . Not on file.   Social History Main Topics  . Smoking status: Former Smoker    Types: Cigarettes    Quit date: 02/17/2013  . Smokeless tobacco: Not on file  . Alcohol Use: No  . Drug Use: No  . Sexual Activity: Not on file   Other Topics Concern  . Not on file   Social History Narrative  . No narrative on file    Review of Systems  Constitutional: Negative for fever, chills, diaphoresis, activity change, appetite change and fatigue.  HENT: Negative for ear pain, nosebleeds, congestion, facial swelling, rhinorrhea, neck pain, neck stiffness and ear discharge.   Eyes: Negative for pain, discharge, redness, itching and visual disturbance.  Respiratory: Negative for cough, choking, chest tightness, shortness of breath, wheezing and stridor.   Cardiovascular: Negative for chest pain, palpitations and leg swelling.  Gastrointestinal: Negative for abdominal  distention.  Genitourinary: Negative for dysuria, urgency, frequency, hematuria, flank pain, decreased urine volume, difficulty urinating and dyspareunia.  Musculoskeletal: Negative for back pain, joint swelling, arthralgias and gait problem.  Neurological: Negative for dizziness, tremors, seizures, syncope, facial asymmetry, speech difficulty, weakness, light-headedness, numbness and positive for headaches.  Hematological: Negative for adenopathy. Does not bruise/bleed easily.  Psychiatric/Behavioral: Negative for hallucinations, behavioral problems, confusion, dysphoric mood, decreased concentration and agitation.    Objective:   Filed Vitals:   05/24/13 1047  BP: 102/64  Pulse: 98  Temp: 97.9 F (36.6 C)  Resp: 16    Physical Exam  Constitutional: Appears well-developed and well-nourished. No distress.  HENT: Normocephalic. External right and left ear normal. Oropharynx is clear and moist.  Eyes: Conjunctivae and EOM are normal. PERRLA, no scleral icterus.  Neck: Normal ROM. Neck supple. No JVD. No tracheal deviation. No thyromegaly.  CVS: RRR, S1/S2 +, no murmurs, no gallops, no carotid bruit.  Pulmonary: Effort and breath sounds normal, no stridor, rhonchi, wheezes, rales.  Abdominal: Soft. BS +,  no distension, tenderness, rebound or guarding.  Musculoskeletal: Normal range of motion. No edema and no tenderness.  Lymphadenopathy: No lymphadenopathy noted, cervical, inguinal. Neuro: Alert. Normal reflexes, muscle tone coordination. No cranial nerve deficit. Skin: Skin is warm and dry. No rash noted. Not diaphoretic. No erythema. No pallor.  Psychiatric: Normal mood and affect. Behavior, judgment, thought content normal.   Lab Results  Component Value Date   WBC 3.5* 02/20/2013  HGB 15.5 02/20/2013   HCT 44.2 02/20/2013   MCV 83.1 02/20/2013   PLT 166 02/20/2013   Lab Results  Component Value Date   CREATININE 0.48* 02/20/2013   BUN 12 02/20/2013   NA 142 02/20/2013   K 4.3  02/20/2013   CL 107 02/20/2013   CO2 29 02/20/2013    No results found for this basename: HGBA1C   Lipid Panel     Component Value Date/Time   CHOL 95 02/20/2013 1302   TRIG 124 02/20/2013 1302   HDL 28* 02/20/2013 1302   CHOLHDL 3.4 02/20/2013 1302   VLDL 25 02/20/2013 1302   LDLCALC 42 02/20/2013 1302       Assessment and plan:   Patient Active Problem List   Diagnosis Date Noted  . Hyperthyroidism 03/02/2013    Priority: Medium - check TSH today - prescription provided for methimazole which pt was non-compliant with prior to this visit - referral to endocrinology provided    Headache - referral to neurology provided
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
## 2013-09-25 ENCOUNTER — Ambulatory Visit: Payer: Self-pay | Admitting: Internal Medicine

## 2013-09-26 ENCOUNTER — Encounter: Payer: Self-pay | Admitting: Internal Medicine

## 2013-09-26 ENCOUNTER — Ambulatory Visit: Payer: Medicaid Other | Attending: Internal Medicine | Admitting: Internal Medicine

## 2013-09-26 VITALS — BP 93/61 | HR 94 | Temp 98.1°F | Resp 16 | Ht 69.0 in | Wt 142.0 lb

## 2013-09-26 DIAGNOSIS — J329 Chronic sinusitis, unspecified: Secondary | ICD-10-CM

## 2013-09-26 DIAGNOSIS — R51 Headache: Secondary | ICD-10-CM | POA: Insufficient documentation

## 2013-09-26 DIAGNOSIS — I959 Hypotension, unspecified: Secondary | ICD-10-CM

## 2013-09-26 DIAGNOSIS — Z09 Encounter for follow-up examination after completed treatment for conditions other than malignant neoplasm: Secondary | ICD-10-CM | POA: Insufficient documentation

## 2013-09-26 DIAGNOSIS — E0500 Thyrotoxicosis with diffuse goiter without thyrotoxic crisis or storm: Secondary | ICD-10-CM | POA: Insufficient documentation

## 2013-09-26 DIAGNOSIS — J3489 Other specified disorders of nose and nasal sinuses: Secondary | ICD-10-CM | POA: Insufficient documentation

## 2013-09-26 DIAGNOSIS — R0981 Nasal congestion: Secondary | ICD-10-CM

## 2013-09-26 DIAGNOSIS — Z87891 Personal history of nicotine dependence: Secondary | ICD-10-CM | POA: Insufficient documentation

## 2013-09-26 DIAGNOSIS — R0982 Postnasal drip: Secondary | ICD-10-CM | POA: Insufficient documentation

## 2013-09-26 MED ORDER — AMOXICILLIN-POT CLAVULANATE 875-125 MG PO TABS: 1.0000 | ORAL_TABLET | Freq: Two times a day (BID) | ORAL | Status: AC

## 2013-09-26 MED ORDER — FLUTICASONE PROPIONATE 50 MCG/ACT NA SUSP: 2.0000 | Freq: Every day | NASAL | Status: AC

## 2013-09-26 MED ORDER — AMOXICILLIN-POT CLAVULANATE 875-125 MG PO TABS: 1.0000 | ORAL_TABLET | Freq: Two times a day (BID) | ORAL | Status: DC

## 2013-09-26 NOTE — Progress Notes (Signed)
MRN: [id number] Name: Bobby Vance  Sex: male Age: 28 y.o. DOB: 1986/02/22  Allergies: Review of patient's allergies indicates no known allergies.  Chief Complaint  Patient presents with  . Follow-up    HPI: Patient is 28 y.o. male who comes today reported to have this chronic headache, also reported to have stuffy nose postnasal drip, denies any fever chills today's blood pressure is on the low side, as per patient he has not been eating well denies any nausea vomiting drink plenty of water, occasionally he feels some left arm numbness when he wake up from sleep but it lasts for a few seconds and resolves, currently denies any numbness weakness chest pain shortness of breath. Patient has history of Graves' disease and as per patient he is going to see endocrinologist referral was already done in the past.  History reviewed. No pertinent past medical history.  History reviewed. No pertinent past surgical history.    Medication List       This list is accurate as of: 09/26/13  4:05 PM.  Always use your most recent med list.               amoxicillin-clavulanate 875-125 MG per tablet  Commonly known as:  AUGMENTIN  Take 1 tablet by mouth 2 (two) times daily.     fluticasone 50 MCG/ACT nasal spray  Commonly known as:  FLONASE  Place 2 sprays into both nostrils daily.     methimazole 5 MG tablet  Commonly known as:  TAPAZOLE  Take 1 tablet (5 mg total) by mouth 3 (three) times daily.     SYSTANE OP  Place 1 drop into both eyes daily as needed. For dry/irritated eyes     traMADol 50 MG tablet  Commonly known as:  ULTRAM  Take 1 tablet (50 mg total) by mouth every 8 (eight) hours as needed for pain.        Meds ordered this encounter  Medications  . DISCONTD: amoxicillin-clavulanate (AUGMENTIN) 875-125 MG per tablet    Sig: Take 1 tablet by mouth 2 (two) times daily.    Dispense:  20 tablet    Refill:  0  . fluticasone (FLONASE) 50 MCG/ACT nasal spray    Sig:  Place 2 sprays into both nostrils daily.    Dispense:  16 g    Refill:  1  . amoxicillin-clavulanate (AUGMENTIN) 875-125 MG per tablet    Sig: Take 1 tablet by mouth 2 (two) times daily.    Dispense:  20 tablet    Refill:  0    Immunization History  Administered Date(s) Administered  . Influenza Split 06/09/2012    Family History  Problem Relation Age of Onset  . Hypertension Mother   . Hypertension Father     History  Substance Use Topics  . Smoking status: Former Smoker    Types: Cigarettes    Quit date: 02/17/2013  . Smokeless tobacco: Not on file  . Alcohol Use: No    Review of Systems   As noted in HPI  Filed Vitals:   09/26/13 1506  BP: 93/61  Pulse: 94  Temp: 98.1 F (36.7 C)  Resp: 16    Physical Exam  Physical Exam  Constitutional: He is oriented to person, place, and time. No distress.  HENT:  Head: Normocephalic and atraumatic.  Sinus tenderness, nasal congestion   Eyes: EOM are normal. Pupils are equal, round, and reactive to light.  Cardiovascular: Normal rate and regular rhythm.  Pulmonary/Chest: Breath sounds normal. No respiratory distress. He has no wheezes. He has no rales.  Neurological: He is alert and oriented to person, place, and time. He has normal reflexes. No cranial nerve deficit. Coordination normal.  Equal strength in all extremities,      CBC    Component Value Date/Time   WBC 3.5* 02/20/2013 1302   RBC 5.32 02/20/2013 1302   HGB 15.5 02/20/2013 1302   HCT 44.2 02/20/2013 1302   PLT 166 02/20/2013 1302   MCV 83.1 02/20/2013 1302   LYMPHSABS 1.0 02/20/2013 1302   MONOABS 0.4 02/20/2013 1302   EOSABS 0.3 02/20/2013 1302   BASOSABS 0.0 02/20/2013 1302    CMP     Component Value Date/Time   NA 142 02/20/2013 1302   K 4.3 02/20/2013 1302   CL 107 02/20/2013 1302   CO2 29 02/20/2013 1302   GLUCOSE 81 02/20/2013 1302   BUN 12 02/20/2013 1302   CREATININE 0.48* 02/20/2013 1302   CALCIUM 10.0 02/20/2013 1302    Lab Results    Component Value Date/Time   CHOL 95 02/20/2013  1:02 PM    No components found with this basename: hga1c    No results found for this basename: AST    Assessment and Plan  Headache(784.0) - Plan: Chronic headache  Ambulatory referral to Neurology  Sinusitis - Plan: amoxicillin-clavulanate (AUGMENTIN) 875-125 MG per tablet, DISCONTINUED: amoxicillin-clavulanate (AUGMENTIN) 875-125 MG per tablet  Stuffy nose - Plan: fluticasone (FLONASE) 50 MCG/ACT nasal spray  Low BP  Advised patient to drink plenty water and juices and eat healthy. Patient will come back in 2 weeks for blood pressure check, I have advised patient to go to ER if symptoms get worse or he develops new symptoms, patient understand and verbalized instructions.   Return for BP check in 2 weeks .  Doris CheadleADVANI, Ray Glacken, MD

## 2013-09-26 NOTE — Progress Notes (Signed)
Pt is here having pain in the front of his head. Pt is also having numbness in his left arm and hand at night Pt reports that his eyes are itchy.

## 2013-10-02 ENCOUNTER — Ambulatory Visit: Payer: Medicaid Other | Admitting: Neurology

## 2013-10-03 ENCOUNTER — Ambulatory Visit: Payer: Medicaid Other | Admitting: Neurology

## 2013-10-05 ENCOUNTER — Encounter: Payer: Self-pay | Admitting: Neurology

## 2013-10-05 ENCOUNTER — Ambulatory Visit (INDEPENDENT_AMBULATORY_CARE_PROVIDER_SITE_OTHER): Payer: Medicaid Other | Admitting: Neurology

## 2013-10-05 VITALS — BP 90/58 | HR 56 | Ht 69.75 in | Wt 143.0 lb

## 2013-10-05 DIAGNOSIS — R51 Headache: Secondary | ICD-10-CM

## 2013-10-05 DIAGNOSIS — R519 Headache, unspecified: Secondary | ICD-10-CM

## 2013-10-05 MED ORDER — AMITRIPTYLINE HCL 10 MG PO TABS: 10.0000 mg | ORAL_TABLET | Freq: Every day | ORAL | Status: DC
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
## 2013-10-19 ENCOUNTER — Encounter: Payer: Self-pay | Admitting: Internal Medicine

## 2013-10-19 ENCOUNTER — Ambulatory Visit: Payer: Medicaid Other | Attending: Internal Medicine | Admitting: Internal Medicine

## 2013-10-19 VITALS — BP 93/57 | HR 62 | Temp 97.9°F | Resp 16 | Wt 141.8 lb

## 2013-10-19 DIAGNOSIS — H538 Other visual disturbances: Secondary | ICD-10-CM | POA: Insufficient documentation

## 2013-10-19 DIAGNOSIS — R519 Headache, unspecified: Secondary | ICD-10-CM

## 2013-10-19 DIAGNOSIS — R51 Headache: Secondary | ICD-10-CM

## 2013-10-19 DIAGNOSIS — F17200 Nicotine dependence, unspecified, uncomplicated: Secondary | ICD-10-CM | POA: Insufficient documentation

## 2013-10-19 MED ORDER — BUTALBITAL-APAP-CAFFEINE 50-325-40 MG PO TABS: 1.0000 | ORAL_TABLET | Freq: Four times a day (QID) | ORAL | Status: AC | PRN

## 2013-10-19 MED ORDER — AMITRIPTYLINE HCL 25 MG PO TABS: 25.0000 mg | ORAL_TABLET | Freq: Every day | ORAL | Status: DC

## 2013-10-19 NOTE — Progress Notes (Signed)
Patient here for headache for the past 5 days Mainly on the left side of his head and behind his left eye Taking medication but not helping-

## 2013-10-19 NOTE — Progress Notes (Signed)
MRN: [id number] Name: Bobby Vance  Sex: male Age: 28 y.o. DOB: 07-22-1986  Allergies: Review of patient's allergies indicates no known allergies.  Chief Complaint  Patient presents with  . Headache    HPI: Patient is 28 y.o. male who has history of chronic daily headaches, already been seen by neurologist was started on Elavil 10 mg each bedtime 2 weeks ago, patient still reports persistent pain, also history of her left eye problems was seen ophthalmologist in the past, but for the last 4 months he has more trouble with his vision, denies any numbness weakness.   History reviewed. No pertinent past medical history.  History reviewed. No pertinent past surgical history.    Medication List       This list is accurate as of: 10/19/13 12:30 PM.  Always use your most recent med list.               amitriptyline 25 MG tablet  Commonly known as:  ELAVIL  Take 1 tablet (25 mg total) by mouth at bedtime.     amoxicillin-clavulanate 875-125 MG per tablet  Commonly known as:  AUGMENTIN  Take 1 tablet by mouth 2 (two) times daily.     butalbital-acetaminophen-caffeine 50-325-40 MG per tablet  Commonly known as:  ESGIC  Take 1 tablet by mouth every 6 (six) hours as needed for headache.     fluticasone 50 MCG/ACT nasal spray  Commonly known as:  FLONASE  Place 2 sprays into both nostrils daily.     methimazole 5 MG tablet  Commonly known as:  TAPAZOLE  Take 1 tablet (5 mg total) by mouth 3 (three) times daily.     SYSTANE OP  Place 1 drop into both eyes daily as needed. For dry/irritated eyes        Meds ordered this encounter  Medications  . butalbital-acetaminophen-caffeine (ESGIC) 50-325-40 MG per tablet    Sig: Take 1 tablet by mouth every 6 (six) hours as needed for headache.    Dispense:  30 tablet    Refill:  0  . amitriptyline (ELAVIL) 25 MG tablet    Sig: Take 1 tablet (25 mg total) by mouth at bedtime.    Dispense:  30 tablet    Refill:  6     Immunization History  Administered Date(s) Administered  . Influenza Split 06/09/2012    Family History  Problem Relation Age of Onset  . Hypertension Mother   . Hypertension Father     History  Substance Use Topics  . Smoking status: Current Every Day Smoker    Types: Cigarettes    Last Attempt to Quit: 02/17/2013  . Smokeless tobacco: Not on file  . Alcohol Use: No    Review of Systems   As noted in HPI  Filed Vitals:   10/19/13 1157  BP: 93/57  Pulse: 62  Temp: 97.9 F (36.6 C)  Resp: 16    Physical Exam  Physical Exam  Constitutional: He is oriented to person, place, and time. No distress.  Eyes: EOM are normal.  Cardiovascular: Normal rate and regular rhythm.   Pulmonary/Chest: No respiratory distress. He has no wheezes. He has no rales.  Neurological: He is alert and oriented to person, place, and time.    CBC    Component Value Date/Time   WBC 3.5* 02/20/2013 1302   RBC 5.32 02/20/2013 1302   HGB 15.5 02/20/2013 1302   HCT 44.2 02/20/2013 1302   PLT 166 02/20/2013 1302  MCV 83.1 02/20/2013 1302   LYMPHSABS 1.0 02/20/2013 1302   MONOABS 0.4 02/20/2013 1302   EOSABS 0.3 02/20/2013 1302   BASOSABS 0.0 02/20/2013 1302    CMP     Component Value Date/Time   NA 142 02/20/2013 1302   K 4.3 02/20/2013 1302   CL 107 02/20/2013 1302   CO2 29 02/20/2013 1302   GLUCOSE 81 02/20/2013 1302   BUN 12 02/20/2013 1302   CREATININE 0.48* 02/20/2013 1302   CALCIUM 10.0 02/20/2013 1302    Lab Results  Component Value Date/Time   CHOL 95 02/20/2013  1:02 PM    No components found with this basename: hga1c    No results found for this basename: AST    Assessment and Plan  Chronic daily headache - Plan: Have increased the dose of amitriptyline (ELAVIL) 25 MG tablet, also prescribedbutalbital-acetaminophen-caffeine (ESGIC) 50-325-40 MG per tablet, for pain, is symptomatically not improving, advised patient follow with neurology.  Blurry vision, left eye -  Plan: Ambulatory referral to Ophthalmology   Doris CheadleADVANI, Liseth Wann, MD

## 2013-10-31 IMAGING — CR DG CHEST 1V
1 series · 1 of 1 positions shown · non-contrast
Comparison: None.

CLINICAL DATA: Positive TB blood test, smoking history

CHEST - 1 VIEW

[view not recorded]
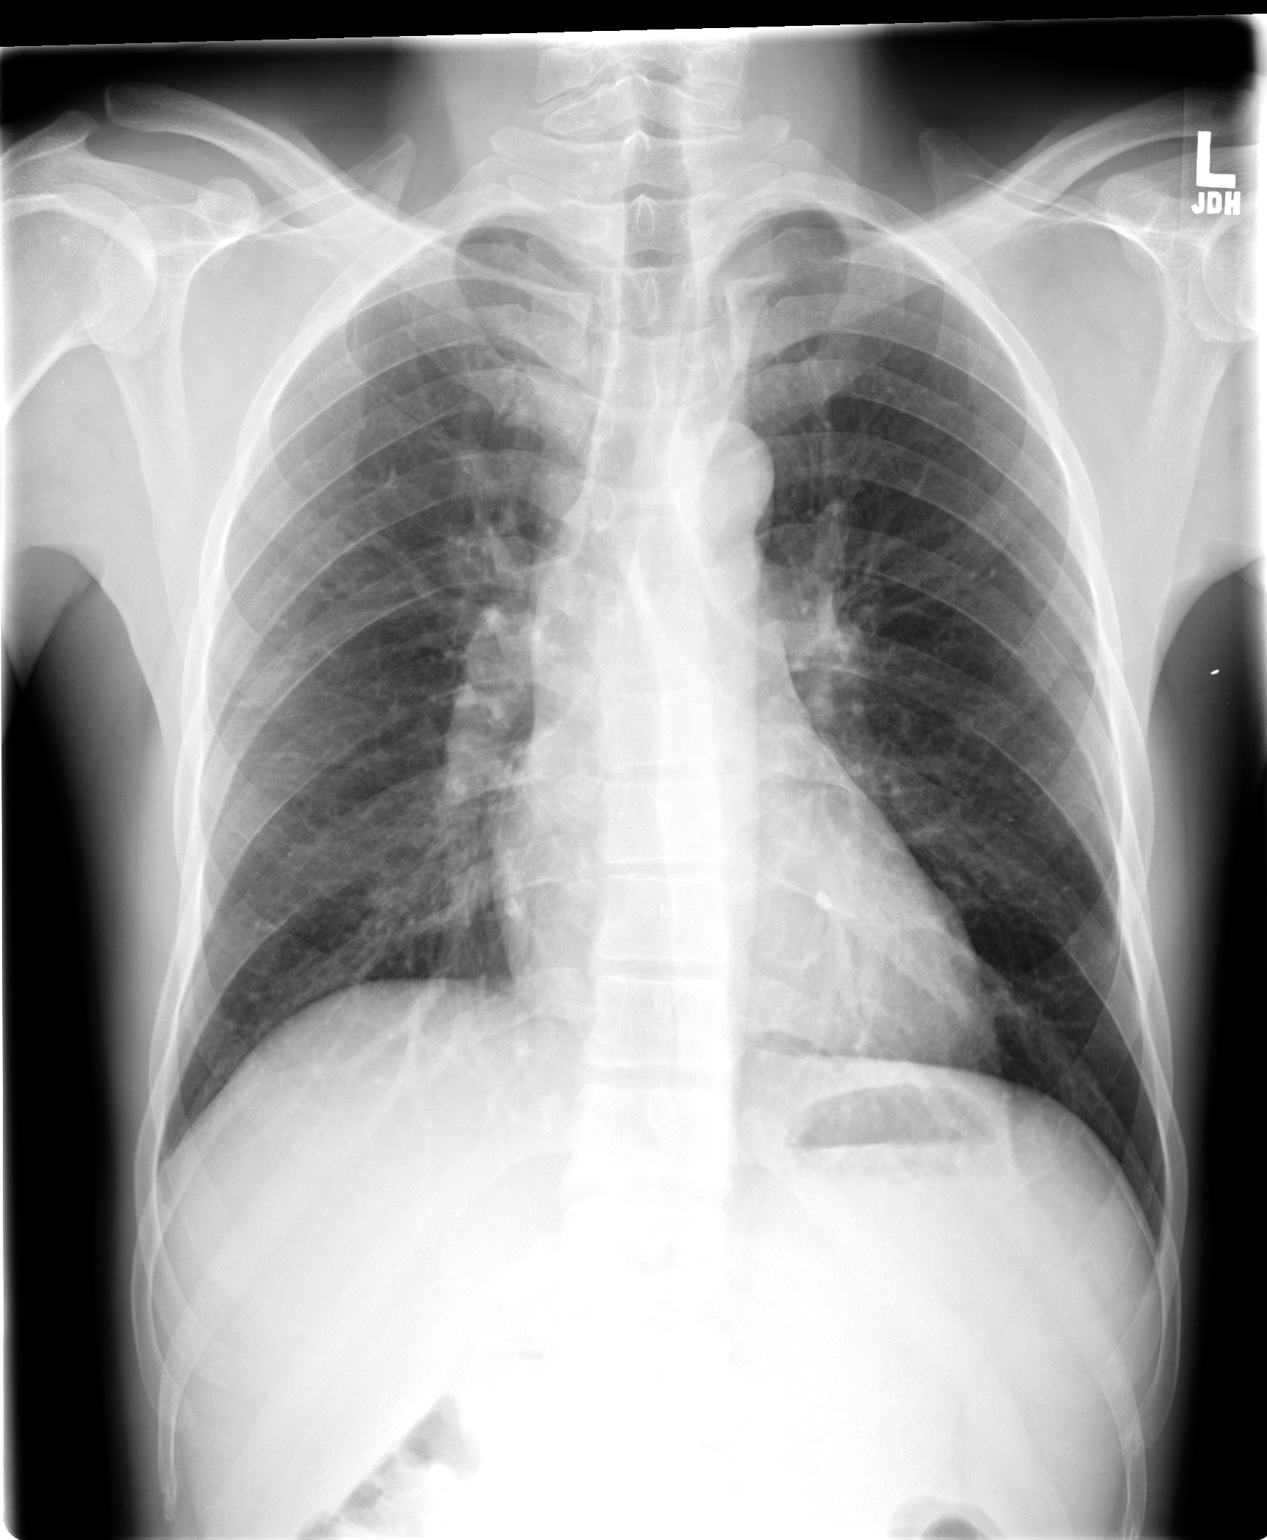

[1 of 1 positions shown; findings below may reference images not displayed]

FINDINGS: No active infiltrate or effusion is seen.  Mediastinal
contours appear normal.  No sequela of prior tuberculous infection
is noted.  The heart is within normal limits in size.  No skeletal
abnormality is seen.
IMPRESSION: No active lung disease.

## 2013-11-22 ENCOUNTER — Encounter: Payer: Self-pay | Admitting: Internal Medicine

## 2013-11-22 ENCOUNTER — Ambulatory Visit: Payer: Medicaid Other | Attending: Internal Medicine | Admitting: Internal Medicine

## 2013-11-22 VITALS — BP 93/58 | HR 71 | Temp 98.6°F | Resp 14 | Ht 69.0 in | Wt 143.0 lb

## 2013-11-22 DIAGNOSIS — F4310 Post-traumatic stress disorder, unspecified: Secondary | ICD-10-CM | POA: Insufficient documentation

## 2013-11-22 DIAGNOSIS — R51 Headache: Secondary | ICD-10-CM | POA: Diagnosis not present

## 2013-11-22 DIAGNOSIS — F17200 Nicotine dependence, unspecified, uncomplicated: Secondary | ICD-10-CM | POA: Insufficient documentation

## 2013-11-22 DIAGNOSIS — Z09 Encounter for follow-up examination after completed treatment for conditions other than malignant neoplasm: Secondary | ICD-10-CM | POA: Insufficient documentation

## 2013-11-22 MED ORDER — ACETAMINOPHEN-CODEINE #3 300-30 MG PO TABS: 1.0000 | ORAL_TABLET | ORAL | Status: AC | PRN

## 2013-11-22 MED ORDER — GABAPENTIN 300 MG PO CAPS: 300.0000 mg | ORAL_CAPSULE | Freq: Three times a day (TID) | ORAL | Status: AC
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
## 2013-11-22 NOTE — Patient Instructions (Signed)
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
## 2014-01-11 ENCOUNTER — Ambulatory Visit: Payer: Medicaid Other | Attending: Internal Medicine | Admitting: Internal Medicine

## 2014-01-11 ENCOUNTER — Encounter: Payer: Self-pay | Admitting: Internal Medicine

## 2014-01-11 VITALS — BP 98/61 | HR 59 | Temp 98.0°F | Resp 14 | Wt 143.6 lb

## 2014-01-11 DIAGNOSIS — F17200 Nicotine dependence, unspecified, uncomplicated: Secondary | ICD-10-CM | POA: Insufficient documentation

## 2014-01-11 DIAGNOSIS — H538 Other visual disturbances: Secondary | ICD-10-CM | POA: Diagnosis not present

## 2014-01-11 DIAGNOSIS — R51 Headache: Secondary | ICD-10-CM | POA: Insufficient documentation

## 2014-01-11 DIAGNOSIS — R519 Headache, unspecified: Secondary | ICD-10-CM

## 2014-01-11 DIAGNOSIS — F4310 Post-traumatic stress disorder, unspecified: Secondary | ICD-10-CM | POA: Insufficient documentation

## 2014-01-11 MED ORDER — TRAMADOL HCL 50 MG PO TABS: 50.0000 mg | ORAL_TABLET | Freq: Three times a day (TID) | ORAL | Status: AC | PRN
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
## 2014-01-11 NOTE — Progress Notes (Signed)
Patient here today with recurrent headache.  Patient was seen 11/22/13 and prescribed medication. Patient states the medication gave him no relief.  Patient denies passing out, seeing black spots, and dizziness.  Patient does state the pain radiates to left eye.

## 2014-01-14 ENCOUNTER — Telehealth: Payer: Self-pay | Admitting: Neurology

## 2014-01-15 NOTE — Telephone Encounter (Addendum)
Called and patient does not speak english, contacted language intepretator-([facility] SW dept at [phone]) so that we can set up appt.with patient ,no answer, left VM message for return call

## 2014-01-15 NOTE — Telephone Encounter (Signed)
Spoke with patient's roommate who interprets for him, scheduled the appt. Spoke with Lawana( [facility] Intepretor), she will have an intepreter to contact patient for visit.

## 2014-02-04 ENCOUNTER — Encounter: Payer: Self-pay | Admitting: Endocrinology

## 2014-02-04 ENCOUNTER — Ambulatory Visit (INDEPENDENT_AMBULATORY_CARE_PROVIDER_SITE_OTHER): Payer: Medicaid Other | Admitting: Endocrinology

## 2014-02-04 VITALS — BP 122/74 | HR 55 | Temp 98.0°F | Resp 14 | Ht 68.25 in | Wt 145.0 lb

## 2014-02-04 DIAGNOSIS — E049 Nontoxic goiter, unspecified: Secondary | ICD-10-CM

## 2014-02-04 DIAGNOSIS — R1310 Dysphagia, unspecified: Secondary | ICD-10-CM

## 2014-02-04 DIAGNOSIS — E0590 Thyrotoxicosis, unspecified without thyrotoxic crisis or storm: Secondary | ICD-10-CM

## 2014-02-04 NOTE — Progress Notes (Signed)
Patient ID: Bobby Vance, male   DOB: 05/12/1986, 28 y.o.   MRN: [id number]                                                                                                        Reason for Appointment:  Hyperthyroidism, new consultation  Referring physician: ADVANI   History of Present Illness:   The patient is a very poor historian and not clear why he was being evaluated about a year ago for his thyroid He was apparently being seen for headaches and TSH was done as a routine screening and found to be low His office notes do not indicate any typical symptoms of hyperthyroidism at that time and he does not remember any symptoms from a year ago He was given a prescription for methimazole and he thinks he took this for about a month but did not feel any different. He stopped this and did not followup [facility] and felt to be having subclinical hyperthyroidism, no treatment given  For the last about 3 months he has had some discomfort in his lower neck and a little difficulty swallowing. Also does not think he has a good appetite Also for the last 4-5 months he has been feeling more tired but not weak in his legs or arms. He does not think he has lost any weight; at times he may feel warm and the last months occasionally feels his heart beating fast  The patient has had the following thyroid function tests in the past  08/16/2013 07/13/2013    0.324 (L) 0.344 (L)    Lab Results  Component Value Date   FREET4 2.51* 03/02/2013   TSH 0.023* 05/24/2013   TSH <0.008* 02/20/2013        Medication List       This list is accurate as of: 02/04/14  2:06 PM.  Always use your most recent med list.               acetaminophen-codeine 300-30 MG per tablet  Commonly known as:  TYLENOL #3  Take 1 tablet by mouth every 4 (four) hours as needed for moderate pain.     amoxicillin-clavulanate 875-125 MG per tablet  Commonly known as:  AUGMENTIN  Take  1 tablet by mouth 2 (two) times daily.     butalbital-acetaminophen-caffeine 50-325-40 MG per tablet  Commonly known as:  ESGIC  Take 1 tablet by mouth every 6 (six) hours as needed for headache.     fluticasone 50 MCG/ACT nasal spray  Commonly known as:  FLONASE  Place 2 sprays into both nostrils daily.     gabapentin 300 MG capsule  Commonly known as:  NEURONTIN  Take 1 capsule (300 mg total) by mouth 3 (three) times daily.     methimazole 5 MG tablet  Commonly known as:  TAPAZOLE  Take 1 tablet (5 mg total) by mouth 3 (three) times daily.     SYSTANE OP  Place 1 drop into both eyes daily as needed. For dry/irritated eyes  traMADol 50 MG tablet  Commonly known as:  ULTRAM  Take 1 tablet (50 mg total) by mouth every 8 (eight) hours as needed.            No past medical history on file.  No past surgical history on file.  Family History  Problem Relation Age of Onset  . Hypertension Mother   . Hypertension Father     Social History:  reports that he has been smoking Cigarettes.  He has been smoking about 0.00 packs per day. He does not have any smokeless tobacco history on file. He reports that he does not drink alcohol or use illicit drugs.  Allergies: No Known Allergies  Review of Systems:  Eyes: Has decreased vision in the left side reportedly from a retinal tear  Has no  history of high blood pressure.       No history of dyspnea on exertion.      No complaints of change in bowel habits.      Negative  history of Diabetes.     No swelling of feet       Sometimes feels a little numbness in his left hand   Examination:   BP 122/74  Pulse 55  Temp(Src) 98 F (36.7 C)  Resp 14  Ht 5' 8.25" (1.734 m)  Wt 145 lb (65.772 kg)  BMI 21.87 kg/m2  SpO2 98%  Pulse repeated: 72   General Appearance:  well-built and nourished, pleasant, not anxious or hyperkinetic..        Eyes: No excessive prominence. Exophthalmometry measurements are 80 mm bilaterally He  has mild right lower leg retraction but no lid lag or stare. No swelling of the eyelids  Neck: The thyroid is enlarged about 1-1/2 times normal, soft and diffuse. No nodules elevated.  There is no lymphadenopathy .          Heart: normal S1 and S2, no murmurs .         Lungs: breath sounds are clear bilaterally  Extremities: hands are warm. No ankle edema. No skin changes on the lower legs Neurological: REFLEXES: at biceps are normal.  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 He will need to be referred back to his PCP for further evaluation   Nils Thor 02/04/2014, 2:06 PM    Addendum: TSH low but free T4 and free T3 are normal, no treatment needed

## 2014-02-05 LAB — T4, FREE: Free T4: 0.9 ng/dL (ref 0.60–1.60)

## 2014-02-05 LAB — TSH: TSH: 0.11 u[IU]/mL — AB (ref 0.35–4.50)

## 2014-02-05 LAB — T3, FREE: T3, Free: 2.8 pg/mL (ref 2.3–4.2)

## 2014-02-05 NOTE — Progress Notes (Signed)
Quick Note:  Please let patient know that the thyroid is not overactive and no further action needed, to see primary care doctor for swallowing difficulties ______

## 2014-02-19 ENCOUNTER — Ambulatory Visit (INDEPENDENT_AMBULATORY_CARE_PROVIDER_SITE_OTHER): Payer: Medicaid Other | Admitting: Neurology

## 2014-02-19 ENCOUNTER — Encounter: Payer: Self-pay | Admitting: Neurology

## 2014-02-19 VITALS — BP 81/58 | HR 60 | Ht 69.25 in | Wt 144.0 lb

## 2014-02-19 DIAGNOSIS — G44329 Chronic post-traumatic headache, not intractable: Secondary | ICD-10-CM

## 2014-02-19 DIAGNOSIS — G44321 Chronic post-traumatic headache, intractable: Secondary | ICD-10-CM

## 2014-02-19 DIAGNOSIS — R51 Headache: Secondary | ICD-10-CM

## 2014-02-19 DIAGNOSIS — R519 Headache, unspecified: Secondary | ICD-10-CM

## 2014-02-19 NOTE — Patient Instructions (Addendum)
Overall you are doing fairly well but I do want to suggest a few things today:   Remember to drink plenty of fluid, eat healthy meals and do not skip any meals. Try to eat protein with a every meal and eat a healthy snack such as fruit or nuts in between meals. Try to keep a regular sleep-wake schedule and try to exercise daily, particularly in the form of walking, 20-30 minutes a day, if you can.   We have offered you medication options but you wish to hold off at this time   Please follow up as needed. Please call us with any interim questions, concerns, problems, updates or refill requests.   Please also call us for any test results so we can go over those with you on the phone.  Our phone number is [phone]. We also have an after hours call service for urgent matters and there is a physician on-call for urgent questions. For any emergencies you know to call 911 or go to the nearest emergency room
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
## 2014-04-16 IMAGING — US US SOFT TISSUE HEAD/NECK
1 series · 14 of 25 positions shown · non-contrast
Comparison: None.

CLINICAL DATA: Hyperthyroidism.

THYROID ULTRASOUND
TECHNIQUE: Ultrasound examination of the thyroid gland and adjacent
soft tissues was performed.

[Series 1: us soft tissue head/neck · 0.08mm/px · 14 of 31 slices shown]
[im 1/31]
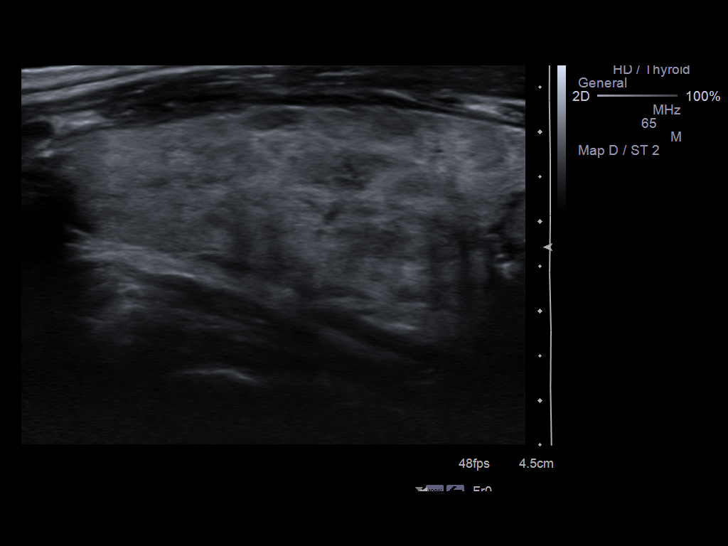
[im 3/31]
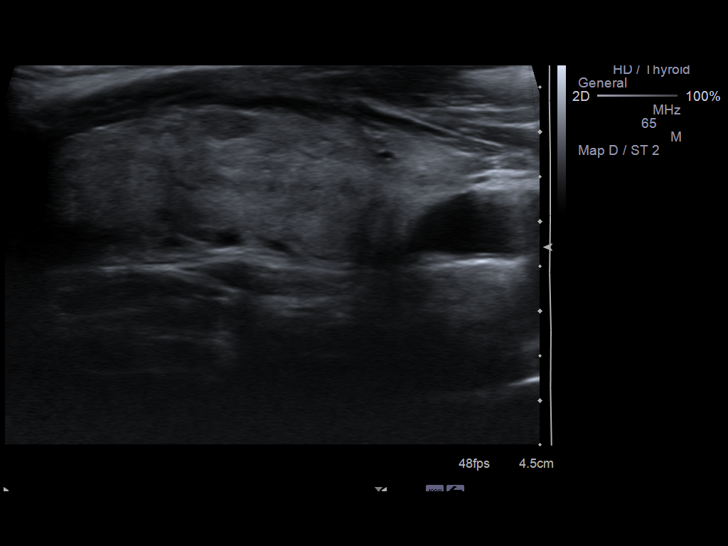
[im 6/31]
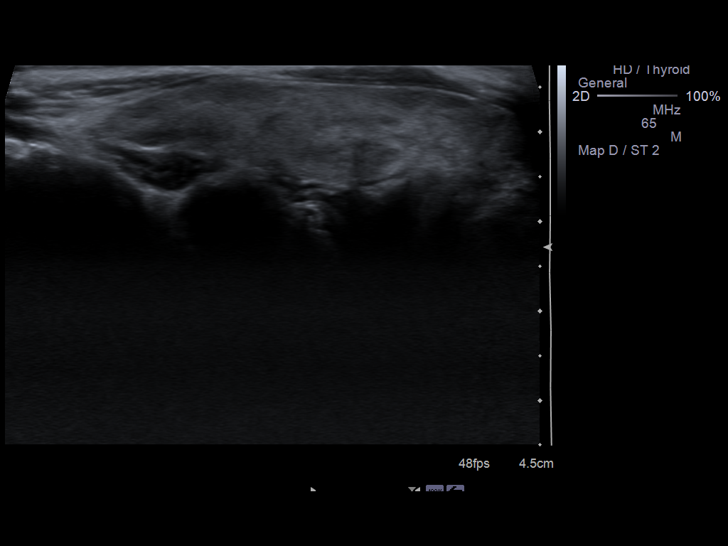
[im 8/31]
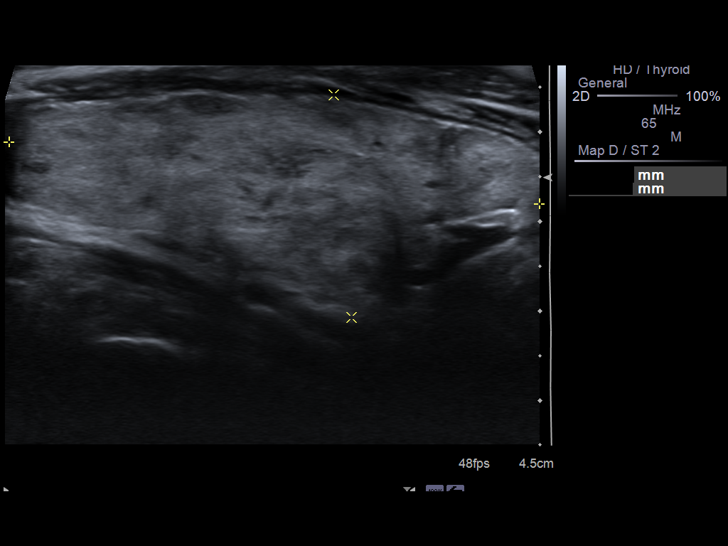
[im 11/31]
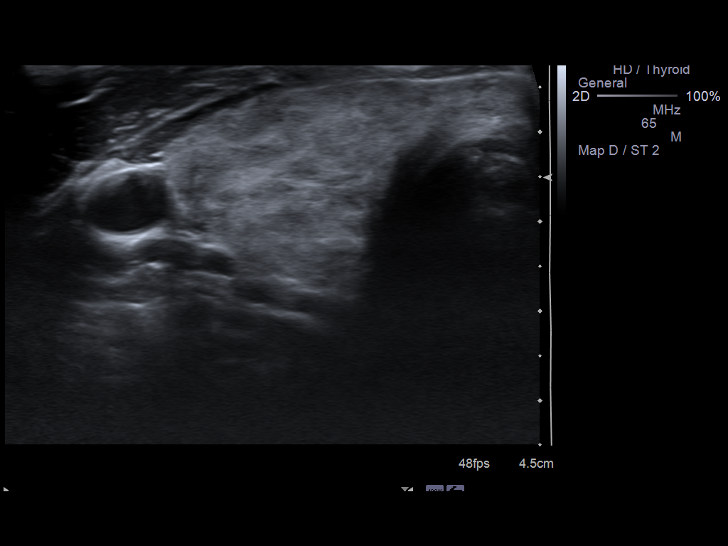
[im 12/31]
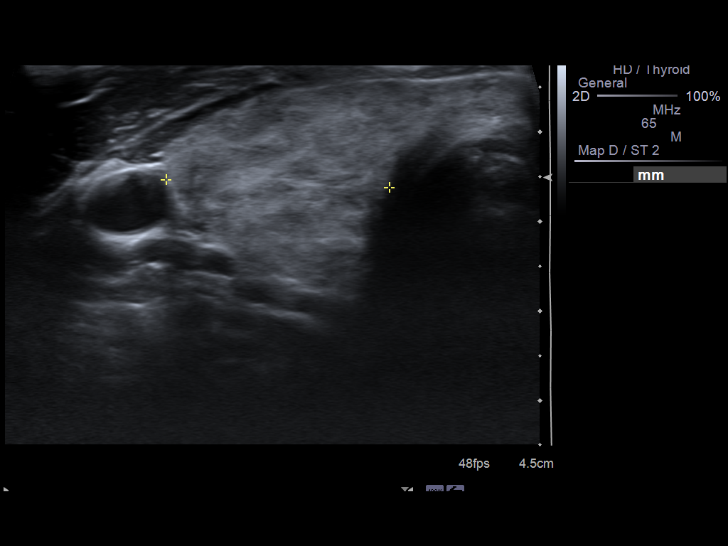
[im 14/31]
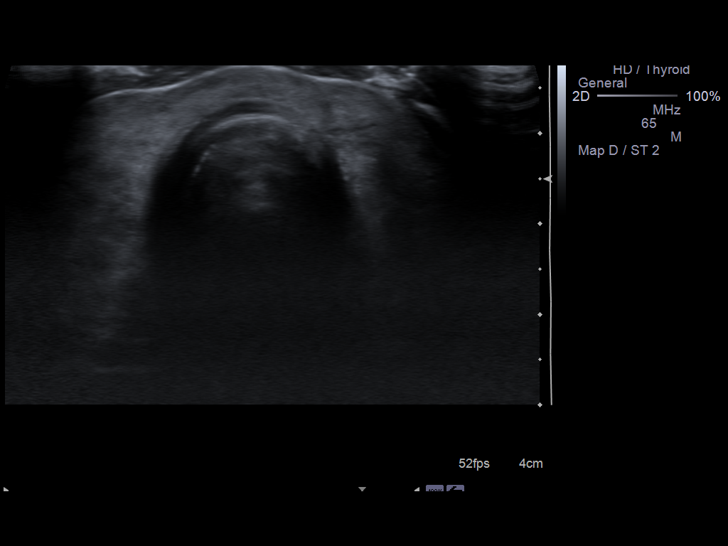
[im 17/31]
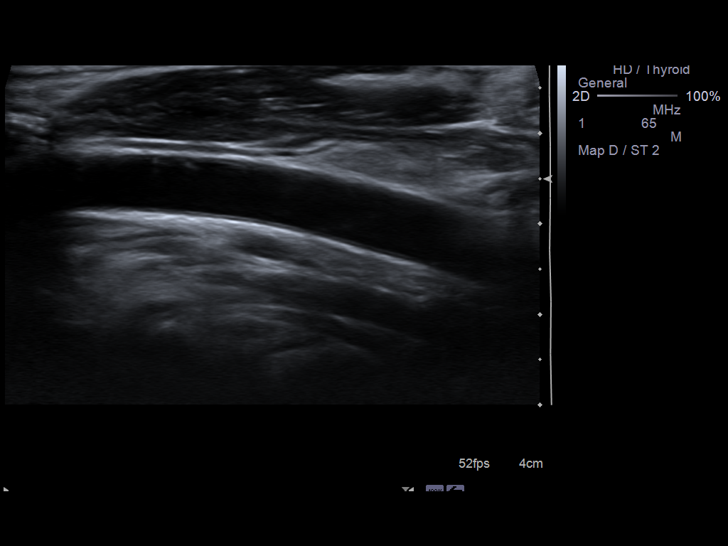
[im 19/31]
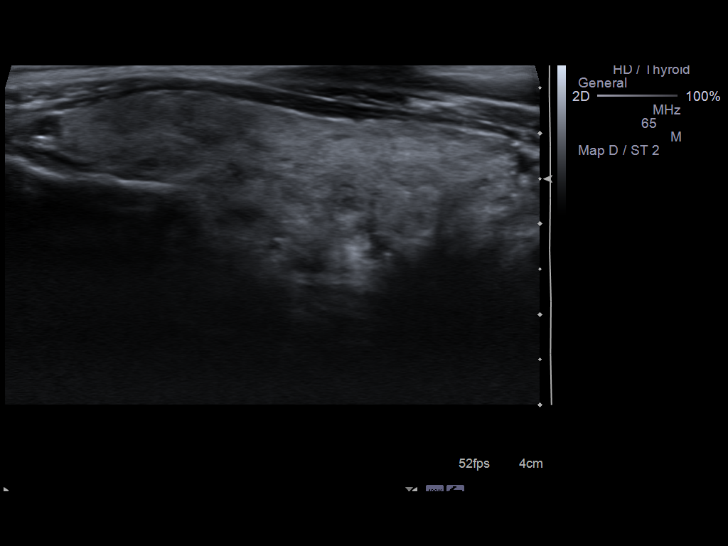
[im 21/31]
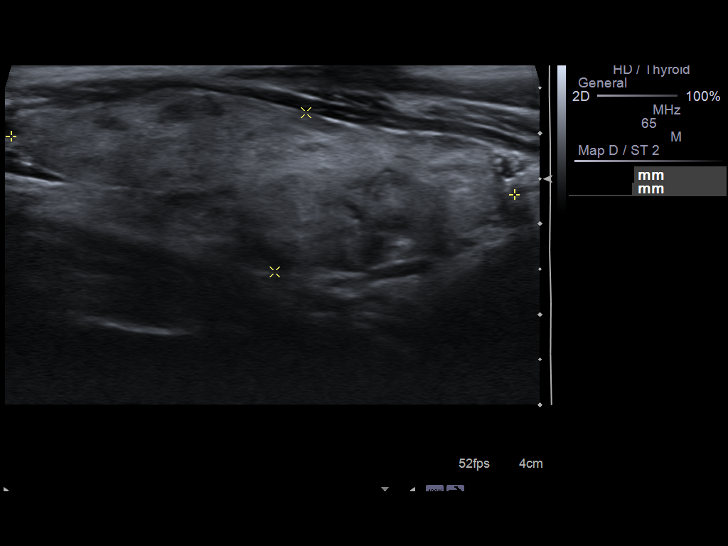
[im 23/31]
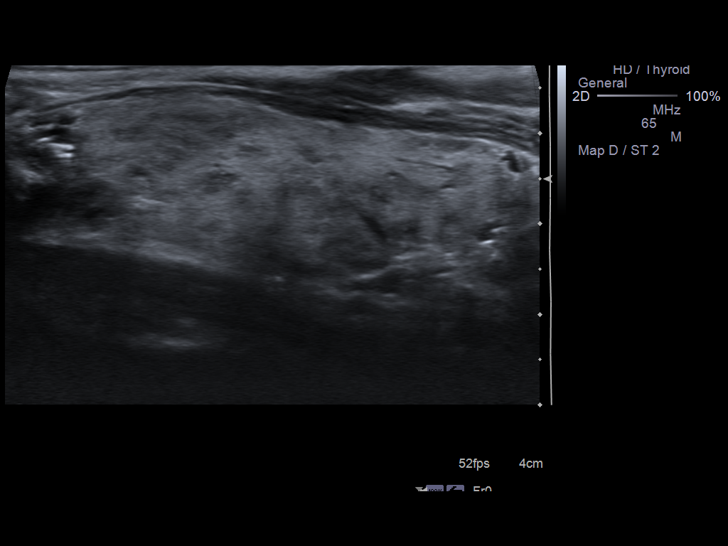
[im 26/31]
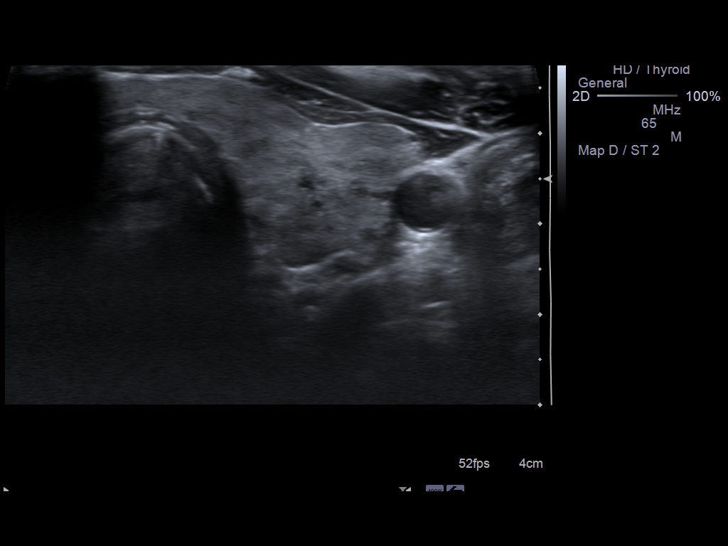
[im 28/31]
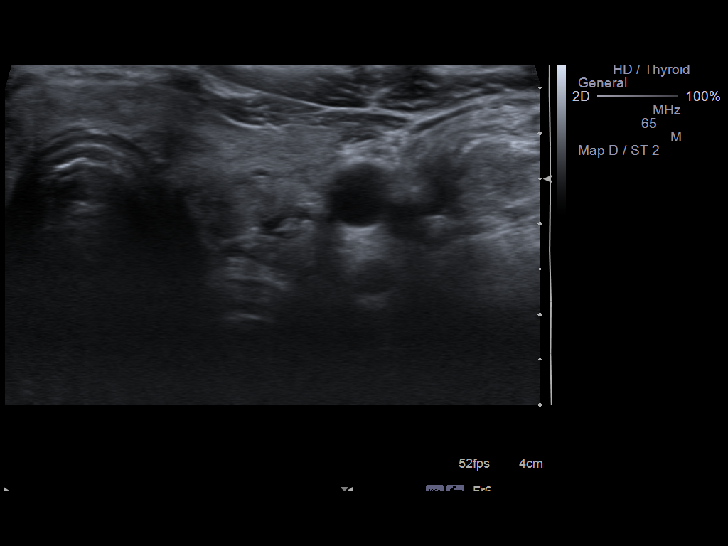
[im 31/31]
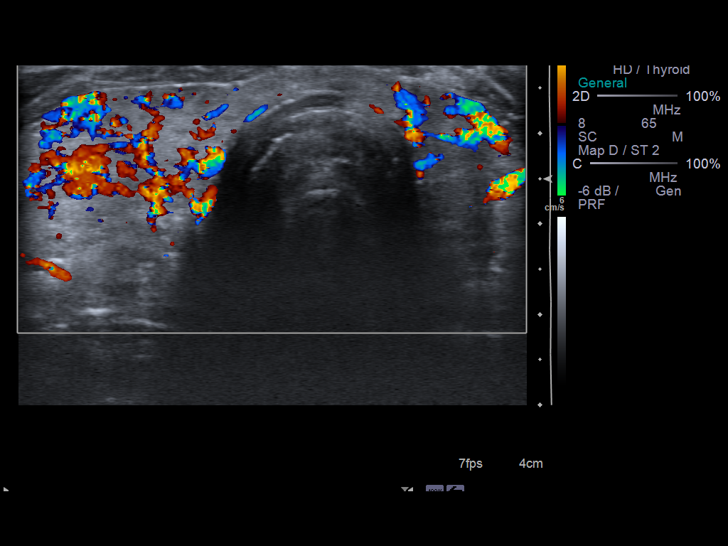

[14 of 25 positions shown; findings below may reference images not displayed]

FINDINGS: Right thyroid lobe:  6.0 x 2.5 x 2.5 cm.
Left thyroid lobe:  5.6 x 1.8 x 2.0 cm.
Isthmus:  0.4 cm.

Focal nodules:  No focal nodules.  Diffuse inhomogeneity and
increased vascularity from the entire thyroid gland.

Lymphadenopathy:  None visualized.
IMPRESSION: Hypervascularity and diffuse inhomogeneity without focal nodules.
The gland is slightly prominent.  The findings are consistent with
Graves' disease.

## 2014-05-21 ENCOUNTER — Telehealth: Payer: Self-pay | Admitting: Neurology

## 2014-05-21 NOTE — Telephone Encounter (Signed)
Prior Dr. Hosie PoissonSumner patient - called to make a follow up visit with another provider. Patient stated he has moved to [place] and has a provider there. GB
